# Patient Record
Sex: Female | Born: 1935 | Race: White | Hispanic: No | Marital: Married | Smoking: Never smoker
Health system: Southern US, Community
[De-identification: ages and names within clinical notes are randomized; demographics above are authoritative.]

## PROBLEM LIST (undated history)

## (undated) DIAGNOSIS — K219 Gastro-esophageal reflux disease without esophagitis: Secondary | ICD-10-CM

## (undated) DIAGNOSIS — C801 Malignant (primary) neoplasm, unspecified: Secondary | ICD-10-CM

## (undated) DIAGNOSIS — I639 Cerebral infarction, unspecified: Secondary | ICD-10-CM

## (undated) DIAGNOSIS — Z974 Presence of external hearing-aid: Secondary | ICD-10-CM

## (undated) DIAGNOSIS — I4891 Unspecified atrial fibrillation: Secondary | ICD-10-CM

## (undated) DIAGNOSIS — R911 Solitary pulmonary nodule: Secondary | ICD-10-CM

## (undated) DIAGNOSIS — R0600 Dyspnea, unspecified: Secondary | ICD-10-CM

## (undated) DIAGNOSIS — Z8719 Personal history of other diseases of the digestive system: Secondary | ICD-10-CM

## (undated) DIAGNOSIS — T8859XA Other complications of anesthesia, initial encounter: Secondary | ICD-10-CM

## (undated) DIAGNOSIS — R519 Headache, unspecified: Secondary | ICD-10-CM

## (undated) DIAGNOSIS — N183 Chronic kidney disease, stage 3 unspecified: Secondary | ICD-10-CM

## (undated) DIAGNOSIS — I10 Essential (primary) hypertension: Secondary | ICD-10-CM

## (undated) DIAGNOSIS — T4145XA Adverse effect of unspecified anesthetic, initial encounter: Secondary | ICD-10-CM

## (undated) DIAGNOSIS — I480 Paroxysmal atrial fibrillation: Secondary | ICD-10-CM

## (undated) DIAGNOSIS — J45909 Unspecified asthma, uncomplicated: Secondary | ICD-10-CM

## (undated) DIAGNOSIS — I059 Rheumatic mitral valve disease, unspecified: Secondary | ICD-10-CM

## (undated) DIAGNOSIS — E785 Hyperlipidemia, unspecified: Secondary | ICD-10-CM

## (undated) DIAGNOSIS — R51 Headache: Secondary | ICD-10-CM

## (undated) DIAGNOSIS — R42 Dizziness and giddiness: Secondary | ICD-10-CM

## (undated) HISTORY — PX: COLONOSCOPY: SHX174

## (undated) HISTORY — PX: TUBAL LIGATION: SHX77

## (undated) HISTORY — PX: OTHER SURGICAL HISTORY: SHX169

## (undated) HISTORY — PX: TONSILLECTOMY: SUR1361

## (undated) HISTORY — PX: HERNIA REPAIR: SHX51

---

## 2006-09-08 ENCOUNTER — Emergency Department: Payer: Self-pay | Admitting: Unknown Physician Specialty

## 2006-09-13 ENCOUNTER — Other Ambulatory Visit: Payer: Self-pay

## 2006-09-13 ENCOUNTER — Emergency Department: Payer: Self-pay | Admitting: Emergency Medicine

## 2007-03-07 ENCOUNTER — Ambulatory Visit: Payer: Self-pay | Admitting: Gastroenterology

## 2008-06-21 ENCOUNTER — Ambulatory Visit: Payer: Self-pay | Admitting: Internal Medicine

## 2008-12-14 HISTORY — PX: BREAST BIOPSY: SHX20

## 2008-12-29 ENCOUNTER — Ambulatory Visit: Payer: Self-pay | Admitting: Internal Medicine

## 2008-12-31 ENCOUNTER — Ambulatory Visit: Payer: Self-pay | Admitting: Internal Medicine

## 2009-01-08 ENCOUNTER — Ambulatory Visit: Payer: Self-pay | Admitting: Internal Medicine

## 2009-11-27 ENCOUNTER — Ambulatory Visit: Payer: Self-pay | Admitting: Internal Medicine

## 2010-04-23 ENCOUNTER — Ambulatory Visit: Payer: Self-pay | Admitting: Surgery

## 2010-04-23 HISTORY — PX: BREAST BIOPSY: SHX20

## 2010-08-20 ENCOUNTER — Observation Stay: Payer: Self-pay | Admitting: *Deleted

## 2011-04-22 ENCOUNTER — Ambulatory Visit: Payer: Self-pay | Admitting: Surgery

## 2011-07-22 ENCOUNTER — Other Ambulatory Visit: Payer: Self-pay | Admitting: Family Medicine

## 2011-07-22 DIAGNOSIS — N63 Unspecified lump in unspecified breast: Secondary | ICD-10-CM

## 2011-07-22 DIAGNOSIS — N644 Mastodynia: Secondary | ICD-10-CM

## 2011-07-22 DIAGNOSIS — N6452 Nipple discharge: Secondary | ICD-10-CM

## 2011-07-22 DIAGNOSIS — N6459 Other signs and symptoms in breast: Secondary | ICD-10-CM

## 2011-07-31 ENCOUNTER — Ambulatory Visit
Admission: RE | Admit: 2011-07-31 | Discharge: 2011-07-31 | Disposition: A | Discharge status: Home / Self Care | Payer: Medicare Other | Source: Ambulatory Visit | Attending: Family Medicine | Admitting: Family Medicine

## 2011-07-31 DIAGNOSIS — N6452 Nipple discharge: Secondary | ICD-10-CM

## 2011-07-31 DIAGNOSIS — N6459 Other signs and symptoms in breast: Secondary | ICD-10-CM

## 2011-07-31 DIAGNOSIS — N63 Unspecified lump in unspecified breast: Secondary | ICD-10-CM

## 2011-07-31 DIAGNOSIS — N644 Mastodynia: Secondary | ICD-10-CM

## 2011-11-11 ENCOUNTER — Ambulatory Visit: Payer: Self-pay | Admitting: Surgery

## 2011-12-15 DIAGNOSIS — I639 Cerebral infarction, unspecified: Secondary | ICD-10-CM

## 2011-12-15 HISTORY — DX: Cerebral infarction, unspecified: I63.9

## 2012-05-11 ENCOUNTER — Ambulatory Visit: Payer: Self-pay | Admitting: Surgery

## 2012-07-24 ENCOUNTER — Emergency Department: Payer: Self-pay | Admitting: Emergency Medicine

## 2012-07-24 LAB — TROPONIN I: Troponin-I: <0.02 ng/mL

## 2012-07-24 LAB — CBC
HCT: 43.4 % (ref 35.0–47.0)
HGB: 15.2 g/dL (ref 12.0–16.0)
RBC: 4.95 10*6/uL (ref 3.80–5.20)
WBC: 7.1 10*3/uL (ref 3.6–11.0)

## 2012-07-24 LAB — BASIC METABOLIC PANEL
Calcium, Total: 9.5 mg/dL (ref 8.5–10.1)
Chloride: 108 mmol/L — ABNORMAL HIGH (ref 98–107)
Co2: 21 mmol/L (ref 21–32)
Creatinine: 0.92 mg/dL (ref 0.60–1.30)
Sodium: 140 mmol/L (ref 136–145)

## 2013-05-23 ENCOUNTER — Ambulatory Visit: Payer: Self-pay | Admitting: Surgery

## 2014-07-03 ENCOUNTER — Ambulatory Visit: Payer: Self-pay | Admitting: Surgery

## 2016-09-04 ENCOUNTER — Other Ambulatory Visit: Payer: Self-pay | Admitting: Family Medicine

## 2016-09-04 DIAGNOSIS — Z1231 Encounter for screening mammogram for malignant neoplasm of breast: Secondary | ICD-10-CM

## 2016-09-10 ENCOUNTER — Other Ambulatory Visit: Payer: Self-pay | Admitting: Family Medicine

## 2016-09-10 ENCOUNTER — Ambulatory Visit
Admission: RE | Admit: 2016-09-10 | Discharge: 2016-09-10 | Disposition: A | Discharge status: Home / Self Care | Payer: Medicare Other | Source: Ambulatory Visit | Attending: Family Medicine | Admitting: Family Medicine

## 2016-09-10 DIAGNOSIS — Z1231 Encounter for screening mammogram for malignant neoplasm of breast: Secondary | ICD-10-CM | POA: Insufficient documentation

## 2016-09-10 DIAGNOSIS — R928 Other abnormal and inconclusive findings on diagnostic imaging of breast: Secondary | ICD-10-CM | POA: Insufficient documentation

## 2016-09-15 ENCOUNTER — Other Ambulatory Visit: Payer: Self-pay | Admitting: Family Medicine

## 2016-09-15 DIAGNOSIS — N6320 Unspecified lump in the left breast, unspecified quadrant: Secondary | ICD-10-CM

## 2016-09-15 DIAGNOSIS — R928 Other abnormal and inconclusive findings on diagnostic imaging of breast: Secondary | ICD-10-CM

## 2016-10-05 ENCOUNTER — Ambulatory Visit
Admission: RE | Admit: 2016-10-05 | Discharge: 2016-10-05 | Disposition: A | Discharge status: Home / Self Care | Payer: Medicare Other | Source: Ambulatory Visit | Attending: Family Medicine | Admitting: Family Medicine

## 2016-10-05 DIAGNOSIS — N6320 Unspecified lump in the left breast, unspecified quadrant: Secondary | ICD-10-CM

## 2016-10-05 DIAGNOSIS — R928 Other abnormal and inconclusive findings on diagnostic imaging of breast: Secondary | ICD-10-CM

## 2016-10-05 DIAGNOSIS — N63 Unspecified lump in unspecified breast: Secondary | ICD-10-CM | POA: Diagnosis present

## 2016-10-05 DIAGNOSIS — N6042 Mammary duct ectasia of left breast: Secondary | ICD-10-CM | POA: Diagnosis not present

## 2017-07-06 ENCOUNTER — Ambulatory Visit: Admission: RE | Admit: 2017-07-06 | Payer: Medicare Other | Source: Ambulatory Visit | Admitting: Gastroenterology

## 2017-07-06 ENCOUNTER — Encounter: Admission: RE | Payer: Self-pay | Source: Ambulatory Visit

## 2017-07-06 SURGERY — COLONOSCOPY WITH PROPOFOL
Anesthesia: General

## 2017-10-06 ENCOUNTER — Other Ambulatory Visit: Payer: Self-pay | Admitting: Family Medicine

## 2017-10-06 DIAGNOSIS — Z1231 Encounter for screening mammogram for malignant neoplasm of breast: Secondary | ICD-10-CM

## 2017-10-26 ENCOUNTER — Ambulatory Visit
Admission: RE | Admit: 2017-10-26 | Discharge: 2017-10-26 | Disposition: A | Discharge status: Home / Self Care | Payer: Medicare Other | Source: Ambulatory Visit | Attending: Family Medicine | Admitting: Family Medicine

## 2017-10-26 DIAGNOSIS — Z1231 Encounter for screening mammogram for malignant neoplasm of breast: Secondary | ICD-10-CM | POA: Insufficient documentation

## 2017-11-30 ENCOUNTER — Ambulatory Visit: Payer: Medicare Other | Attending: Otolaryngology | Admitting: Speech Pathology

## 2017-11-30 DIAGNOSIS — R490 Dysphonia: Secondary | ICD-10-CM | POA: Diagnosis not present

## 2017-12-01 ENCOUNTER — Encounter: Payer: Self-pay | Admitting: Speech Pathology

## 2017-12-01 ENCOUNTER — Other Ambulatory Visit: Payer: Self-pay

## 2017-12-01 NOTE — Therapy (Signed)
[place] MAIN [facility] SERVICES [address] [place], [place], [address] Phone: [phone]   Fax:  [phone]  Speech Language Pathology Evaluation  Patient Details  Name: Traci Holland MRN: [id number] Date of Birth: [birth year] Referring Provider: Margaretha Sheffield   Encounter Date: 11/30/2017  End of Session - 12/01/17 1338    Visit Number  1    Number of Visits  17    Date for SLP Re-Evaluation  01/31/18    SLP Start Time  1500    SLP Stop Time   1550    SLP Time Calculation (min)  50 min    Activity Tolerance  Patient tolerated treatment well       History reviewed. No pertinent past medical history.  Past Surgical History:  Procedure Laterality Date  . BREAST BIOPSY Left 04/23/2010   bx /clip-neg  . BREAST BIOPSY Right 2010   neg    There were no vitals filed for this visit.      SLP Evaluation [facility] - 12/01/17 0001      SLP Visit Information   SLP Received On  11/30/17    Referring Provider  Margaretha Sheffield    Onset Date  10/12/2017      Subjective   Subjective  My voice feels "tight" after I have been talking    Patient/Family Stated Goal  reduce hoarseness and improve vocal endurance      General Information   HPI  81 year old woman, with 9 month history of hoarseness, referred by Dr. Kathyrn Sheriff for voice therapy.  Per report, abnormal laryngeal findings include: arytenoid edema, arytenoid erythema, interarytenoid erythema, interarytenoid pachydermia, and false cord voicing.       Prior Functional Status   Cognitive/Linguistic Baseline  Within functional limits      Oral Motor/Sensory Function   Overall Oral Motor/Sensory Function  Appears within functional limits for tasks assessed      Motor Speech   Overall Motor Speech  Impaired    Respiration  Impaired    Level of Impairment  Conversation    Phonation  Hoarse;Low vocal intensity;Aphonic    Resonance  Within functional limits    Articulation  Within functional  limitis    Intelligibility  Intelligible    Motor Planning  Witnin functional limits    Phonation  Impaired    Vocal Abuses  Habitual Hyperphonia;Habitual Cough/Throat Clear    Tension Present  Jaw;Neck;Shoulder    Volume  Soft    Pitch  Appropriate      Standardized Assessments   Standardized Assessments   Other Assessment Perceptual Voice Evaluation        Perceptual Voice Evaluation Voice checklist: . Health risks: GERD, allergies  . Characteristic voice use: patient states she talks and sings a lot  . Environmental risks: sole caregiver for relative with dementia . Misuse: excessive talking/singing . Abuse: coughing/throat clearing . Vocal characteristics: vocal fatigue, limited pitch range, poor vocal projection, tight/strained phonation Maximum phonation time for sustained "ah": 5 seconds Average time patient was able to sustain /s/: 4 seconds Average time patient was able to sustain /z/: 4 seconds s/z ratio : 1 Education: Patient instructed in extrinsic laryngeal muscle stretches and breath support exercises  SLP Education - 12/01/17 1337    Education provided  Yes    Education Details  Voice therapy, neck/tongue/throat stretches, breath support exercises    Person(s) Educated  Patient    Methods  Explanation;Demonstration;Handout    Comprehension  Verbalized understanding;Need  further instruction         SLP Long Term Goals - 12/01/17 1340      SLP LONG TERM GOAL #1   Title  The patient will demonstrate independent understanding of vocal hygiene concepts and extrinsic laryngeal muscle stretches.      Time  8    Period  Weeks    Status  New    Target Date  01/31/18      SLP LONG TERM GOAL #2   Title  The patient will be independent for abdominal breathing and breath support exercises.    Time  8    Period  Weeks    Status  New    Target Date  01/31/18      SLP LONG TERM GOAL #3   Title  The patient will minimize vocal tension via resonant voice therapy  (or comparable technique) with min SLP cues with 80% accuracy.    Time  8    Period  Weeks    Status  New    Target Date  01/31/18      SLP LONG TERM GOAL #4   Title  The patient will maintain relaxed phonation / oral resonance for paragraph length recitation with 80% accuracy.    Time  8    Period  Weeks    Status  New    Target Date  01/31/18       Plan - 12/01/17 1339    Clinical Impression Statement  This 81 year old woman under the care of Dr. Kathyrn Sheriff, with arytenoid edema, arytenoid erythema, interarytenoid erythema, interarytenoid pachydermia, and false cord voicing, is presenting with moderate dysphonia characterized by hoarse vocal quality, vocal fatigue, reduced breath control for speech, reduced pitch range, and intrinsic/extrinsic laryngeal tension.  The patient will benefit from voice therapy for education, to improve breath support, improve tone focus, promote easy flow phonation, and learn techniques to increase loudness and pitch range without strain.    Speech Therapy Frequency  2x / week    Duration  Other (comment) 8 weeks    Potential to Achieve Goals  Good    Potential Considerations  Ability to learn/carryover information;Severity of impairments;Co-morbidities;Cooperation/participation level;Family/community support;Medical prognosis;Previous level of function    SLP Home Exercise Plan  neck/tongue/throat stretches, breath support exercises    Consulted and Agree with Plan of Care  Patient       Patient will benefit from skilled therapeutic intervention in order to improve the following deficits and impairments:   Dysphonia - Plan: SLP plan of care cert/re-cert  G-Codes - 05/39/76 1344    Functional Assessment Tool Used  Perceptual Voice Evaluation, clinical judgment    Functional Limitations  Voice    Voice Current Status (G9171)  At least 40 percent but less than 60 percent impaired, limited or restricted    Voice Goal Status (G9172)  At least 1 percent but  less than 20 percent impaired, limited or restricted       Problem List There are no active problems to display for this patient.  Leroy Sea, MS/CCC- SLP  Lou Miner 12/01/2017, 1:46 PM  [place] MAIN [facility] SERVICES [address] [place], [place], [address] Phone: [phone]   Fax:  [phone]  Name: Traci Holland MRN: [id number] Date of Birth: [birth year]

## 2017-12-02 ENCOUNTER — Ambulatory Visit: Payer: Medicare Other | Admitting: Speech Pathology

## 2017-12-02 ENCOUNTER — Other Ambulatory Visit: Payer: Self-pay

## 2017-12-02 ENCOUNTER — Encounter: Payer: Self-pay | Admitting: Speech Pathology

## 2017-12-02 DIAGNOSIS — R490 Dysphonia: Secondary | ICD-10-CM | POA: Diagnosis not present

## 2017-12-02 NOTE — Therapy (Signed)
[place] MAIN [facility] SERVICES [address] [place], [place], [address] Phone: [phone]   Fax:  [phone]  Speech Language Pathology Treatment  Patient Details  Name: Traci Holland MRN: [id number] Date of Birth: Nov 03, 1936 Referring Provider: Margaretha Sheffield   Encounter Date: 12/02/2017  End of Session - 12/02/17 1559    Visit Number  2    Number of Visits  17    Date for SLP Re-Evaluation  01/31/18    SLP Start Time  1500    SLP Stop Time   1545    SLP Time Calculation (min)  45 min    Activity Tolerance  Patient tolerated treatment well       History reviewed. No pertinent past medical history.  Past Surgical History:  Procedure Laterality Date  . BREAST BIOPSY Left 04/23/2010   bx /clip-neg  . BREAST BIOPSY Right 2010   neg    There were no vitals filed for this visit.  Subjective Assessment - 12/02/17 1558    Subjective  "I've been trying!"            ADULT SLP TREATMENT - 12/02/17 0001      General Information   Behavior/Cognition  Alert;Cooperative;Pleasant mood    HPI  81 year old woman, with 9 month history of hoarseness, referred by Dr. Kathyrn Sheriff for voice therapy.  Per report, abnormal laryngeal findings include: arytenoid edema, arytenoid erythema, interarytenoid erythema, interarytenoid pachydermia, and false cord voicing.        Treatment Provided   Treatment provided  Cognitive-Linquistic      Pain Assessment   Pain Assessment  No/denies pain      Cognitive-Linquistic Treatment   Treatment focused on  Voice    Skilled Treatment  The patient was provided with written and verbal teaching regarding neck, shoulder, tongue, and throat stretches exercises to promote relaxed phonation. The patient was provided with written and verbal teaching for supplement vocal tract relaxation exercises (straw phonation).  The patient was provided with written and verbal teaching regarding breath support exercises.  The  patient is able to sustain unvoiced fricative up to 14 seconds (only 4 seconds at the evaluation).  Patient instructed in relaxed phonation / oral resonance. The patient responded well to resonant voice therapy, specifically the hum.  She can achieve clear vocal quality with hum and inconsistently with /m/ + vowel syllables.      Assessment / Recommendations / Plan   Plan  Continue with current plan of care      Progression Toward Goals   Progression toward goals  Progressing toward goals       SLP Education - 12/02/17 1558    Education provided  Yes    Education Details  resonant voice, voice production    Person(s) Educated  Patient    Methods  Explanation    Comprehension  Verbalized understanding         SLP Long Term Goals - 12/01/17 1340      SLP LONG TERM GOAL #1   Title  The patient will demonstrate independent understanding of vocal hygiene concepts and extrinsic laryngeal muscle stretches.      Time  8    Period  Weeks    Status  New    Target Date  01/31/18      SLP LONG TERM GOAL #2   Title  The patient will be independent for abdominal breathing and breath support exercises.    Time  8  Period  Weeks    Status  New    Target Date  01/31/18      SLP LONG TERM GOAL #3   Title  The patient will minimize vocal tension via resonant voice therapy (or comparable technique) with min SLP cues with 80% accuracy.    Time  8    Period  Weeks    Status  New    Target Date  01/31/18      SLP LONG TERM GOAL #4   Title  The patient will maintain relaxed phonation / oral resonance for paragraph length recitation with 80% accuracy.    Time  8    Period  Weeks    Status  New    Target Date  01/31/18       Plan - 12/02/17 1559    Clinical Impression Statement  The patient is able to maintain airflow with greater control.  She responded well to resonant voice therapy, specifically the hum.  She can achieve clear vocal quality with hum and inconsistently with /m/ +  vowel syllables.    Speech Therapy Frequency  2x / week    Duration  Other (comment)    Potential to Achieve Goals  Good    Potential Considerations  Ability to learn/carryover information;Severity of impairments;Co-morbidities;Cooperation/participation level;Family/community support;Medical prognosis;Previous level of function    SLP Home Exercise Plan  neck/tongue/throat stretches, breath support exercises, resonant voice exercise    Consulted and Agree with Plan of Care  Patient       Patient will benefit from skilled therapeutic intervention in order to improve the following deficits and impairments:   Dysphonia  G-Codes - 12-29-2017 1344    Functional Assessment Tool Used  Perceptual Voice Evaluation, clinical judgment    Functional Limitations  Voice    Voice Current Status (G9171)  At least 40 percent but less than 60 percent impaired, limited or restricted    Voice Goal Status (G9172)  At least 1 percent but less than 20 percent impaired, limited or restricted       Problem List There are no active problems to display for this patient.  Leroy Sea, [place], Susie 12/02/2017, 4:00 PM  [place] MAIN [facility] SERVICES [address] [place], [place], [address] Phone: [phone]   Fax:  [phone]   Name: Traci Holland MRN: [id number] Date of Birth: [birth year]

## 2017-12-10 ENCOUNTER — Ambulatory Visit: Payer: Medicare Other | Admitting: Speech Pathology

## 2017-12-15 ENCOUNTER — Other Ambulatory Visit: Payer: Self-pay

## 2017-12-15 ENCOUNTER — Encounter: Payer: Self-pay | Admitting: Speech Pathology

## 2017-12-15 ENCOUNTER — Ambulatory Visit: Payer: Medicare Other | Attending: Otolaryngology | Admitting: Speech Pathology

## 2017-12-15 DIAGNOSIS — R490 Dysphonia: Secondary | ICD-10-CM | POA: Diagnosis present

## 2017-12-15 NOTE — Therapy (Signed)
[place] MAIN [facility] SERVICES [address] [place], [place], [address] Phone: [phone]   Fax:  [phone]  Speech Language Pathology Treatment  Patient Details  Name: Adriyana Greenbaum MRN: [id number] Date of Birth: 12-Jul-1936 Referring Provider: Margaretha Sheffield   Encounter Date: 12/15/2017  End of Session - 12/15/17 1556    Visit Number  3    Number of Visits  17    Date for SLP Re-Evaluation  01/31/18    SLP Start Time  8242    SLP Stop Time   3536    SLP Time Calculation (min)  59 min    Activity Tolerance  Patient tolerated treatment well       History reviewed. No pertinent past medical history.  Past Surgical History:  Procedure Laterality Date  . BREAST BIOPSY Left 04/23/2010   bx /clip-neg  . BREAST BIOPSY Right 2010   neg    There were no vitals filed for this visit.  Subjective Assessment - 12/15/17 1556    Subjective  "I've been trying!"    Currently in Pain?  No/denies            ADULT SLP TREATMENT - 12/15/17 0001      General Information   Behavior/Cognition  Alert;Cooperative;Pleasant mood    HPI  82 year old woman, with 9 month history of hoarseness, referred by Dr. Kathyrn Sheriff for voice therapy.  Per report, abnormal laryngeal findings include: arytenoid edema, arytenoid erythema, interarytenoid erythema, interarytenoid pachydermia, and false cord voicing.        Treatment Provided   Treatment provided  Cognitive-Linquistic      Pain Assessment   Pain Assessment  No/denies pain      Cognitive-Linquistic Treatment   Treatment focused on  Voice    Skilled Treatment  The patient was provided with written and verbal teaching regarding neck, shoulder, tongue, and throat stretches exercises to promote relaxed phonation. The patient was provided with written and verbal teaching for supplement vocal tract relaxation exercises (straw phonation).  The patient was provided with written and verbal teaching regarding  breath support exercises.  The patient is able to sustain unvoiced fricative up to 20 seconds (only 4 seconds at the evaluation).  Patient instructed in relaxed phonation / oral resonance. The patient responded well to resonant voice therapy, specifically the hum.  She can achieve clear vocal quality with hum and with /m/ + vowel syllables.  Patient reports that her voice sounds more hoarse to herself than it does to me.  She tried phonating (singing, sustained vowel, conversation) with the hearing aids out and reported that she felt better and sounded less raspy (to herself).  Her voice was less strained without the aids in.       Assessment / Recommendations / Plan   Plan  Continue with current plan of care      Progression Toward Goals   Progression toward goals  Progressing toward goals       SLP Education - 12/15/17 1556    Education provided  Yes    Education Details  voice production    Person(s) Educated  Patient    Methods  Explanation    Comprehension  Verbalized understanding         SLP Long Term Goals - 12/01/17 1340      SLP LONG TERM GOAL #1   Title  The patient will demonstrate independent understanding of vocal hygiene concepts and extrinsic laryngeal muscle stretches.  Time  8    Period  Weeks    Status  New    Target Date  01/31/18      SLP LONG TERM GOAL #2   Title  The patient will be independent for abdominal breathing and breath support exercises.    Time  8    Period  Weeks    Status  New    Target Date  01/31/18      SLP LONG TERM GOAL #3   Title  The patient will minimize vocal tension via resonant voice therapy (or comparable technique) with min SLP cues with 80% accuracy.    Time  8    Period  Weeks    Status  New    Target Date  01/31/18      SLP LONG TERM GOAL #4   Title  The patient will maintain relaxed phonation / oral resonance for paragraph length recitation with 80% accuracy.    Time  8    Period  Weeks    Status  New    Target  Date  01/31/18       Plan - 12/15/17 1557    Clinical Impression Statement  The patient is able to maintain airflow with greater control.  She responded well to resonant voice therapy, specifically the hum.  She can achieve clear vocal quality with hum and inconsistently with /m/ + vowel syllables.  She appears to be getting faulty feedback from her hearing aids and will practice reading aloud and singing with the aids out.    Speech Therapy Frequency  2x / week    Duration  Other (comment)    Treatment/Interventions  Other (comment) Voice therapy    Potential to Achieve Goals  Good    Potential Considerations  Ability to learn/carryover information;Severity of impairments;Co-morbidities;Cooperation/participation level;Family/community support;Medical prognosis;Previous level of function    SLP Home Exercise Plan  neck/tongue/throat stretches, breath support exercises, resonant voice exercise    Consulted and Agree with Plan of Care  Patient       Patient will benefit from skilled therapeutic intervention in order to improve the following deficits and impairments:   Dysphonia    Problem List There are no active problems to display for this patient.  Leroy Sea, [place], Susie 12/15/2017, 3:58 PM  [place] MAIN [facility] SERVICES [address] [place], [place], [address] Phone: [phone]   Fax:  [phone]   Name: Colisha Redler MRN: [id number] Date of Birth: 1935-12-25

## 2017-12-22 ENCOUNTER — Encounter: Payer: Self-pay | Admitting: Speech Pathology

## 2017-12-22 ENCOUNTER — Other Ambulatory Visit: Payer: Self-pay

## 2017-12-22 ENCOUNTER — Ambulatory Visit: Payer: Medicare Other | Admitting: Speech Pathology

## 2017-12-22 DIAGNOSIS — R490 Dysphonia: Secondary | ICD-10-CM

## 2017-12-22 NOTE — Therapy (Signed)
[place] MAIN [facility] SERVICES [address] [place], [place], [address] Phone: [phone]   Fax:  [phone]  Speech Language Pathology Treatment  Patient Details  Name: Analiya Porco MRN: [id number] Date of Birth: Nov 30, 1936 Referring Provider: Margaretha Sheffield   Encounter Date: 12/22/2017  End of Session - 12/22/17 1305    Visit Number  4    Number of Visits  17    Date for SLP Re-Evaluation  01/31/18    SLP Start Time  1000    SLP Stop Time   1287    SLP Time Calculation (min)  45 min    Activity Tolerance  Patient tolerated treatment well       History reviewed. No pertinent past medical history.  Past Surgical History:  Procedure Laterality Date  . BREAST BIOPSY Left 04/23/2010   bx /clip-neg  . BREAST BIOPSY Right 2010   neg    There were no vitals filed for this visit.  Subjective Assessment - 12/22/17 1304    Subjective  "I've been trying!"    Currently in Pain?  No/denies            ADULT SLP TREATMENT - 12/22/17 0001      General Information   Behavior/Cognition  Alert;Cooperative;Pleasant mood    HPI  82 year old woman, with 9 month history of hoarseness, referred by Dr. Kathyrn Sheriff for voice therapy.  Per report, abnormal laryngeal findings include: arytenoid edema, arytenoid erythema, interarytenoid erythema, interarytenoid pachydermia, and false cord voicing.        Treatment Provided   Treatment provided  Cognitive-Linquistic      Pain Assessment   Pain Assessment  No/denies pain      Cognitive-Linquistic Treatment   Treatment focused on  Voice    Skilled Treatment  The patient was provided with written and verbal teaching regarding neck, shoulder, tongue, and throat stretches exercises to promote relaxed phonation. The patient was provided with written and verbal teaching for supplement vocal tract relaxation exercises (straw phonation).  The patient was provided with written and verbal teaching regarding  breath support exercises.  The patient is able to sustain unvoiced fricative up to 20 seconds (only 4 seconds at the evaluation).  Patient instructed in relaxed phonation / oral resonance. Patient reports that her voice sounds more hoarse to herself than it does to me, her vocal loudness is very soft.  She is able to have 40 minute conversation with improved vocal quality and loudness with the hearing aids out; she reported that she felt better and sounded less raspy (to herself).        Assessment / Recommendations / Plan   Plan  Continue with current plan of care      Progression Toward Goals   Progression toward goals  Progressing toward goals       SLP Education - 12/22/17 1305    Education provided  Yes    Education Details  practice with hearing aids out    Person(s) Educated  Patient    Methods  Explanation    Comprehension  Verbalized understanding         SLP Long Term Goals - 12/01/17 1340      SLP LONG TERM GOAL #1   Title  The patient will demonstrate independent understanding of vocal hygiene concepts and extrinsic laryngeal muscle stretches.      Time  8    Period  Weeks    Status  New  Target Date  01/31/18      SLP LONG TERM GOAL #2   Title  The patient will be independent for abdominal breathing and breath support exercises.    Time  8    Period  Weeks    Status  New    Target Date  01/31/18      SLP LONG TERM GOAL #3   Title  The patient will minimize vocal tension via resonant voice therapy (or comparable technique) with min SLP cues with 80% accuracy.    Time  8    Period  Weeks    Status  New    Target Date  01/31/18      SLP LONG TERM GOAL #4   Title  The patient will maintain relaxed phonation / oral resonance for paragraph length recitation with 80% accuracy.    Time  8    Period  Weeks    Status  New    Target Date  01/31/18       Plan - 12/22/17 1305    Clinical Impression Statement  The patient is able to maintain airflow with greater  control.  She responded well to resonant voice therapy, specifically the hum.  She appears to be getting faulty feedback from her hearing aids; today she was able to maintain appropriate vocal loudness and quality for 30-40 minutes of conversation with the hearing aids out.      Speech Therapy Frequency  2x / week    Duration  Other (comment)    Treatment/Interventions  Other (comment);Patient/family education Voice therapy    Potential to Achieve Goals  Good    Potential Considerations  Ability to learn/carryover information;Severity of impairments;Co-morbidities;Cooperation/participation level;Family/community support;Medical prognosis;Previous level of function    SLP Home Exercise Plan  neck/tongue/throat stretches, breath support exercises, resonant voice exercise    Consulted and Agree with Plan of Care  Patient       Patient will benefit from skilled therapeutic intervention in order to improve the following deficits and impairments:   Dysphonia    Problem List There are no active problems to display for this patient.  Leroy Sea, MS/CCC- SLP  Lou Miner 12/22/2017, 1:06 PM  [place] MAIN [facility] SERVICES [address] [place], [place], [address] Phone: [phone]   Fax:  [phone]   Name: Shaquanna Lycan MRN: [id number] Date of Birth: [birth year]

## 2017-12-24 ENCOUNTER — Encounter: Payer: Self-pay | Admitting: Speech Pathology

## 2017-12-24 ENCOUNTER — Ambulatory Visit: Payer: Medicare Other | Admitting: Speech Pathology

## 2017-12-24 DIAGNOSIS — R490 Dysphonia: Secondary | ICD-10-CM

## 2017-12-24 NOTE — Therapy (Signed)
[place] MAIN [facility] SERVICES [address] [place], [place], [address] Phone: [phone]   Fax:  [phone]  Speech Language Pathology Treatment  Patient Details  Name: Traci Holland MRN: [id number] Date of Birth: 21-May-1936 Referring Provider: Margaretha Sheffield   Encounter Date: 12/24/2017  End of Session - 12/24/17 1209    Visit Number  5    Number of Visits  17    Date for SLP Re-Evaluation  01/31/18    SLP Start Time  58    SLP Stop Time   1150    SLP Time Calculation (min)  60 min    Activity Tolerance  Patient tolerated treatment well       History reviewed. No pertinent past medical history.  Past Surgical History:  Procedure Laterality Date  . BREAST BIOPSY Left 04/23/2010   bx /clip-neg  . BREAST BIOPSY Right 2010   neg    There were no vitals filed for this visit.  Subjective Assessment - 12/24/17 1208    Subjective  "I've been trying!"            ADULT SLP TREATMENT - 12/24/17 0001      General Information   Behavior/Cognition  Alert;Cooperative;Pleasant mood    HPI  82 year old woman, with 9 month history of hoarseness, referred by Dr. Kathyrn Sheriff for voice therapy.  Per report, abnormal laryngeal findings include: arytenoid edema, arytenoid erythema, interarytenoid erythema, interarytenoid pachydermia, and false cord voicing.        Treatment Provided   Treatment provided  Cognitive-Linquistic      Pain Assessment   Pain Assessment  No/denies pain      Cognitive-Linquistic Treatment   Treatment focused on  Voice    Skilled Treatment  The patient was provided with written and verbal teaching regarding neck, shoulder, tongue, and throat stretches exercises to promote relaxed phonation. The patient was provided with written and verbal teaching for supplement vocal tract relaxation exercises (straw phonation).  The patient was provided with written and verbal teaching regarding breath support exercises.  The  patient is able to sustain unvoiced fricative up to 20 seconds (only 4 seconds at the evaluation).  Patient instructed in relaxed phonation / oral resonance. Patient reports that her voice sounds more hoarse to herself than it does to me, her vocal loudness is very soft.  Patient able to generate louder, better quality voice for sustained vowel and automatic speech series with 80% accuracy.  She is able to detect tense phonation and demonstrates emerging ability to repair tense productions.      Assessment / Recommendations / Plan   Plan  Continue with current plan of care      Progression Toward Goals   Progression toward goals  Progressing toward goals       SLP Education - 12/24/17 1208    Education provided  Yes    Education Details  learn to reproduce good sound    Person(s) Educated  Patient    Methods  Explanation    Comprehension  Verbalized understanding         SLP Long Term Goals - 12/01/17 1340      SLP LONG TERM GOAL #1   Title  The patient will demonstrate independent understanding of vocal hygiene concepts and extrinsic laryngeal muscle stretches.      Time  8    Period  Weeks    Status  New    Target Date  01/31/18  SLP LONG TERM GOAL #2   Title  The patient will be independent for abdominal breathing and breath support exercises.    Time  8    Period  Weeks    Status  New    Target Date  01/31/18      SLP LONG TERM GOAL #3   Title  The patient will minimize vocal tension via resonant voice therapy (or comparable technique) with min SLP cues with 80% accuracy.    Time  8    Period  Weeks    Status  New    Target Date  01/31/18      SLP LONG TERM GOAL #4   Title  The patient will maintain relaxed phonation / oral resonance for paragraph length recitation with 80% accuracy.    Time  8    Period  Weeks    Status  New    Target Date  01/31/18       Plan - 12/24/17 1210    Clinical Impression Statement  The patient is able to generate a louder,  better quality voice with focus on vocal loudness to reduce laryngeal tension.  She demonstrates ability to detect tense voice and emerging ability to repair tense productions.    Speech Therapy Frequency  2x / week    Duration  Other (comment)    Treatment/Interventions  Other (comment);Patient/family education Voice therapy    Potential to Achieve Goals  Good    Potential Considerations  Ability to learn/carryover information;Severity of impairments;Co-morbidities;Cooperation/participation level;Family/community support;Medical prognosis;Previous level of function    SLP Home Exercise Plan  neck/tongue/throat stretches, breath support exercises, resonant voice exercise    Consulted and Agree with Plan of Care  Patient       Patient will benefit from skilled therapeutic intervention in order to improve the following deficits and impairments:   Dysphonia    Problem List There are no active problems to display for this patient.  Leroy Sea, MS/CCC- SLP  Lou Miner 12/24/2017, 12:13 PM  [place] MAIN [facility] SERVICES [address] [place], [place], [address] Phone: [phone]   Fax:  [phone]   Name: Traci Holland MRN: [id number] Date of Birth: 1936-09-10

## 2017-12-27 ENCOUNTER — Encounter: Payer: Self-pay | Admitting: Speech Pathology

## 2017-12-27 ENCOUNTER — Ambulatory Visit: Payer: Medicare Other | Admitting: Speech Pathology

## 2017-12-27 DIAGNOSIS — R490 Dysphonia: Secondary | ICD-10-CM

## 2017-12-27 NOTE — Therapy (Signed)
[place] MAIN [facility] SERVICES [address] [place], [place], [address] Phone: [phone]   Fax:  [phone]  Speech Language Pathology Treatment  Patient Details  Name: Traci Holland MRN: [id number] Date of Birth: 1936/05/09 Referring Provider: Margaretha Sheffield   Encounter Date: 12/27/2017  End of Session - 12/27/17 1438    Visit Number  6    Number of Visits  17    Date for SLP Re-Evaluation  01/31/18    SLP Start Time  6440    SLP Stop Time   1425    SLP Time Calculation (min)  40 min    Activity Tolerance  Patient tolerated treatment well       History reviewed. No pertinent past medical history.  Past Surgical History:  Procedure Laterality Date  . BREAST BIOPSY Left 04/23/2010   bx /clip-neg  . BREAST BIOPSY Right 2010   neg    There were no vitals filed for this visit.  Subjective Assessment - 12/27/17 1436    Subjective  Patient reports (and states son concurs) that her loudness and vocal quality are much better when she does not wear her hearing aids            ADULT SLP TREATMENT - 12/27/17 0001      General Information   Behavior/Cognition  Alert;Cooperative;Pleasant mood    HPI  82 year old woman, with 9 month history of hoarseness, referred by Dr. Kathyrn Sheriff for voice therapy.  Per report, abnormal laryngeal findings include: arytenoid edema, arytenoid erythema, interarytenoid erythema, interarytenoid pachydermia, and false cord voicing.        Treatment Provided   Treatment provided  Cognitive-Linquistic      Pain Assessment   Pain Assessment  No/denies pain      Cognitive-Linquistic Treatment   Treatment focused on  Voice    Skilled Treatment  The patient was provided with written and verbal teaching regarding neck, shoulder, tongue, and throat stretches exercises to promote relaxed phonation. The patient was provided with written and verbal teaching for supplement vocal tract relaxation exercises (straw  phonation).  The patient was provided with written and verbal teaching regarding breath support exercises.  The patient is able to sustain unvoiced fricative up to 20 seconds (only 4 seconds at the evaluation).  Patient instructed in relaxed phonation / oral resonance. Patient reports that her voice sounds more hoarse to herself than it does to me, her vocal loudness is very soft.  Patient able to generate louder, better quality voice for sustained vowel, automatic speech series with 80% accuracy and reading sentences with 60% accuracy.  She is able to detect tense phonation and demonstrates emerging ability to repair tense productions.      Assessment / Recommendations / Plan   Plan  Continue with current plan of care      Progression Toward Goals   Progression toward goals  Progressing toward goals       SLP Education - 12/27/17 1437    Education provided  Yes    Education Details  alternate louder speech without hearing aids and with hearing aids to try to adjust to the feedback    Person(s) Educated  Patient    Methods  Explanation    Comprehension  Verbalized understanding         SLP Long Term Goals - 12/01/17 1340      SLP LONG TERM GOAL #1   Title  The patient will demonstrate independent understanding of vocal  hygiene concepts and extrinsic laryngeal muscle stretches.      Time  8    Period  Weeks    Status  New    Target Date  01/31/18      SLP LONG TERM GOAL #2   Title  The patient will be independent for abdominal breathing and breath support exercises.    Time  8    Period  Weeks    Status  New    Target Date  01/31/18      SLP LONG TERM GOAL #3   Title  The patient will minimize vocal tension via resonant voice therapy (or comparable technique) with min SLP cues with 80% accuracy.    Time  8    Period  Weeks    Status  New    Target Date  01/31/18      SLP LONG TERM GOAL #4   Title  The patient will maintain relaxed phonation / oral resonance for paragraph  length recitation with 80% accuracy.    Time  8    Period  Weeks    Status  New    Target Date  01/31/18       Plan - 12/27/17 1438    Clinical Impression Statement  The patient is able to generate a louder, better quality voice with focus on vocal loudness to reduce laryngeal tension.  She demonstrates ability to detect tense voice and emerging ability to repair tense productions.    Speech Therapy Frequency  2x / week    Duration  Other (comment)    Treatment/Interventions  Other (comment);Patient/family education Voice therapy    Potential to Achieve Goals  Good    Potential Considerations  Ability to learn/carryover information;Severity of impairments;Co-morbidities;Cooperation/participation level;Family/community support;Medical prognosis;Previous level of function    SLP Home Exercise Plan  neck/tongue/throat stretches, breath support exercises, resonant voice exercise    Consulted and Agree with Plan of Care  Patient       Patient will benefit from skilled therapeutic intervention in order to improve the following deficits and impairments:   Dysphonia    Problem List There are no active problems to display for this patient.  Leroy Sea, MS/CCC- SLP  Lou Miner 12/27/2017, 2:39 PM  [place] MAIN [facility] SERVICES [address] [place], [place], [address] Phone: [phone]   Fax:  [phone]   Name: Traci Holland MRN: [id number] Date of Birth: [birth year]

## 2017-12-30 ENCOUNTER — Ambulatory Visit: Payer: Medicare Other | Admitting: Speech Pathology

## 2017-12-30 DIAGNOSIS — R490 Dysphonia: Secondary | ICD-10-CM | POA: Diagnosis not present

## 2017-12-31 ENCOUNTER — Encounter: Payer: Self-pay | Admitting: Speech Pathology

## 2017-12-31 NOTE — Therapy (Signed)
[place] MAIN [facility] SERVICES [address] [place], [place], [address] Phone: [phone]   Fax:  [phone]  Speech Language Pathology Treatment  Patient Details  Name: Traci Holland MRN: [id number] Date of Birth: Feb 21, 1936 Referring Provider: Margaretha Sheffield   Encounter Date: 12/30/2017  End of Session - 12/31/17 0954    Visit Number  7    Number of Visits  17    Date for SLP Re-Evaluation  01/31/18    SLP Start Time  1400    SLP Stop Time   1454    SLP Time Calculation (min)  54 min    Activity Tolerance  Patient tolerated treatment well       History reviewed. No pertinent past medical history.  Past Surgical History:  Procedure Laterality Date  . BREAST BIOPSY Left 04/23/2010   bx /clip-neg  . BREAST BIOPSY Right 2010   neg    There were no vitals filed for this visit.  Subjective Assessment - 12/31/17 0953    Subjective  Patient reports (and states son concurs) that her loudness and vocal quality are much better when she does not wear her hearing aids            ADULT SLP TREATMENT - 12/31/17 0001      General Information   Behavior/Cognition  Alert;Cooperative;Pleasant mood    HPI  82 year old woman, with 9 month history of hoarseness, referred by Dr. Kathyrn Sheriff for voice therapy.  Per report, abnormal laryngeal findings include: arytenoid edema, arytenoid erythema, interarytenoid erythema, interarytenoid pachydermia, and false cord voicing.        Treatment Provided   Treatment provided  Cognitive-Linquistic      Pain Assessment   Pain Assessment  No/denies pain      Cognitive-Linquistic Treatment   Treatment focused on  Voice    Skilled Treatment  The patient was provided with written and verbal teaching regarding neck, shoulder, tongue, and throat stretches exercises to promote relaxed phonation. The patient was provided with written and verbal teaching for supplement vocal tract relaxation exercises (straw  phonation).  The patient was provided with written and verbal teaching regarding breath support exercises.  The patient is able to sustain unvoiced fricative up to 20 seconds (only 4 seconds at the evaluation).  Patient instructed in relaxed phonation / oral resonance. Patient reports that her voice sounds more hoarse to herself than it does to me, her vocal loudness is very soft.  The patient and family members have noted that her voice is more clear and louder when she does not have her hearing aids in (she reports uncomfortable feedback if she tries to speak louder). Today, with hearing aids turned down to about half way, she is able to have 40 minute conversation with improved vocal quality and loudness with the hearing aids out; she reported that she felt better and sounded less raspy (to herself).        Assessment / Recommendations / Plan   Plan  Continue with current plan of care      Progression Toward Goals   Progression toward goals  Progressing toward goals       SLP Education - 12/31/17 0953    Education provided  Yes    Education Details  alternate luder speech with and without hearing aids to adjust to hearing aid feedback    Person(s) Educated  Patient    Methods  Explanation    Comprehension  Verbalized understanding  SLP Long Term Goals - 12/01/17 1340      SLP LONG TERM GOAL #1   Title  The patient will demonstrate independent understanding of vocal hygiene concepts and extrinsic laryngeal muscle stretches.      Time  8    Period  Weeks    Status  New    Target Date  01/31/18      SLP LONG TERM GOAL #2   Title  The patient will be independent for abdominal breathing and breath support exercises.    Time  8    Period  Weeks    Status  New    Target Date  01/31/18      SLP LONG TERM GOAL #3   Title  The patient will minimize vocal tension via resonant voice therapy (or comparable technique) with min SLP cues with 80% accuracy.    Time  8    Period   Weeks    Status  New    Target Date  01/31/18      SLP LONG TERM GOAL #4   Title  The patient will maintain relaxed phonation / oral resonance for paragraph length recitation with 80% accuracy.    Time  8    Period  Weeks    Status  New    Target Date  01/31/18       Plan - 12/31/17 0955    Clinical Impression Statement  The patient is able to generate a louder, better quality voice with focus on vocal loudness to reduce laryngeal tension.  She demonstrates ability to detect tense voice and emerging ability to repair tense productions.    Speech Therapy Frequency  2x / week    Duration  Other (comment)    Treatment/Interventions  Other (comment);Patient/family education Voice therapy    Potential to Achieve Goals  Good    SLP Home Exercise Plan  neck/tongue/throat stretches, breath support exercises, resonant voice exercise    Consulted and Agree with Plan of Care  Patient       Patient will benefit from skilled therapeutic intervention in order to improve the following deficits and impairments:   Dysphonia    Problem List There are no active problems to display for this patient.  Leroy Sea, MS/CCC- SLP  Traci Holland 12/31/2017, 9:55 AM  [place] MAIN [facility] SERVICES [address] [place], [place], [address] Phone: [phone]   Fax:  [phone]   Name: Traci Holland MRN: [id number] Date of Birth: Sep 10, 1936

## 2018-01-03 ENCOUNTER — Ambulatory Visit: Payer: Medicare Other | Admitting: Speech Pathology

## 2018-01-03 DIAGNOSIS — R490 Dysphonia: Secondary | ICD-10-CM

## 2018-01-04 ENCOUNTER — Encounter: Payer: Self-pay | Admitting: Speech Pathology

## 2018-01-04 NOTE — Therapy (Signed)
[place] MAIN [facility] SERVICES [address] [place], [place], [address] Phone: [phone]   Fax:  [phone]  Speech Language Pathology Treatment  Patient Details  Name: Traci Holland MRN: [id number] Date of Birth: [birth year] Referring Provider: Margaretha Sheffield   Encounter Date: 01/03/2018  End of Session - 01/04/18 0831    Visit Number  8    Number of Visits  17    Date for SLP Re-Evaluation  01/31/18    SLP Start Time  1351    SLP Stop Time   1436    SLP Time Calculation (min)  45 min    Activity Tolerance  Patient tolerated treatment well       History reviewed. No pertinent past medical history.  Past Surgical History:  Procedure Laterality Date  . BREAST BIOPSY Left 04/23/2010   bx /clip-neg  . BREAST BIOPSY Right 2010   neg    There were no vitals filed for this visit.  Subjective Assessment - 01/04/18 0830    Subjective  Patient reports (and states son concurs) that her loudness and vocal quality are much better when she does not wear her hearing aids            ADULT SLP TREATMENT - 01/04/18 0001      General Information   Behavior/Cognition  Alert;Cooperative;Pleasant mood    HPI  82 year old woman, with 9 month history of hoarseness, referred by Dr. Kathyrn Sheriff for voice therapy.  Per report, abnormal laryngeal findings include: arytenoid edema, arytenoid erythema, interarytenoid erythema, interarytenoid pachydermia, and false cord voicing.        Treatment Provided   Treatment provided  Cognitive-Linquistic      Pain Assessment   Pain Assessment  No/denies pain      Cognitive-Linquistic Treatment   Treatment focused on  Voice    Skilled Treatment  The patient was provided with written and verbal teaching regarding neck, shoulder, tongue, and throat stretches exercises to promote relaxed phonation. The patient was provided with written and verbal teaching for supplement vocal tract relaxation exercises (straw  phonation).  The patient was provided with written and verbal teaching regarding breath support exercises.  The patient is able to sustain unvoiced fricative up to 20 seconds (only 4 seconds at the evaluation).  Patient instructed in relaxed phonation / oral resonance. Patient reports that her voice sounds more hoarse to herself than it does to me, her vocal loudness is very soft.  The patient and family members have noted that her voice is more clear and louder when she does not have her hearing aids in (she reports uncomfortable feedback if she tries to speak louder). Today, with hearing aids in place, she is able to have 40 minute conversation with improved vocal quality and loudness; she reported that she felt better and sounded less raspy (to herself).        Assessment / Recommendations / Plan   Plan  Continue with current plan of care      Progression Toward Goals   Progression toward goals  Progressing toward goals       SLP Education - 01/04/18 0830    Education provided  Yes    Education Details  continue to try to adjust to speaking louder with hearing aids in place    Person(s) Educated  Patient    Methods  Explanation    Comprehension  Verbalized understanding         SLP Long Term Goals -  12/01/17 [address]   Title  The patient will demonstrate independent understanding of vocal hygiene concepts and extrinsic laryngeal muscle stretches.      Time  8    Period  Weeks    Status  New    Target Date  01/31/18      SLP LONG TERM GOAL #2   Title  The patient will be independent for abdominal breathing and breath support exercises.    Time  8    Period  Weeks    Status  New    Target Date  01/31/18      SLP LONG TERM GOAL #3   Title  The patient will minimize vocal tension via resonant voice therapy (or comparable technique) with min SLP cues with 80% accuracy.    Time  8    Period  Weeks    Status  New    Target Date  01/31/18      SLP LONG  TERM GOAL #4   Title  The patient will maintain relaxed phonation / oral resonance for paragraph length recitation with 80% accuracy.    Time  8    Period  Weeks    Status  New    Target Date  01/31/18       Plan - 01/04/18 0831    Clinical Impression Statement  The patient is able to generate a louder, better quality voice with focus on vocal loudness to reduce laryngeal tension.  She demonstrates ability to detect tense voice and emerging ability to repair tense productions.    Speech Therapy Frequency  2x / week    Duration  Other (comment)    Treatment/Interventions  Other (comment);Patient/family education Voice therapy    Potential to Achieve Goals  Good    Potential Considerations  Ability to learn/carryover information;Severity of impairments;Co-morbidities;Cooperation/participation level;Family/community support;Medical prognosis;Previous level of function    SLP Home Exercise Plan  neck/tongue/throat stretches, breath support exercises, resonant voice exercise    Consulted and Agree with Plan of Care  Patient       Patient will benefit from skilled therapeutic intervention in order to improve the following deficits and impairments:   Dysphonia    Problem List There are no active problems to display for this patient.  Leroy Sea, MS/CCC- SLP  Lou Miner 01/04/2018, 8:32 AM  [place] MAIN [facility] SERVICES [address] [place], [place], [address] Phone: [phone]   Fax:  [phone]   Name: Traci Holland MRN: [id number] Date of Birth: [birth year]

## 2018-01-06 ENCOUNTER — Ambulatory Visit: Payer: Medicare Other | Admitting: Speech Pathology

## 2018-01-06 ENCOUNTER — Encounter: Payer: Self-pay | Admitting: Speech Pathology

## 2018-01-06 DIAGNOSIS — R490 Dysphonia: Secondary | ICD-10-CM

## 2018-01-06 NOTE — Therapy (Signed)
[facility] [address] [place], [place], [address] Phone: [phone]   Fax:  [phone]  Speech Language Pathology Treatment  Patient Details  Name: Traci [facility] MRN: [id number] Date of Birth: 1936-06-19 Referring Provider: Margaretha Sheffield   Encounter Date: 01/06/2018  End of Session - 01/06/18 1455    Visit Number  9    Number of Visits  17    Date for SLP Re-Evaluation  01/31/18    SLP Start Time  1400    SLP Stop Time   1450    SLP Time Calculation (min)  50 min    Activity Tolerance  Patient tolerated treatment well       History reviewed. No pertinent past medical history.  Past Surgical History:  Procedure Laterality Date  . BREAST BIOPSY Left 04/23/2010   bx /clip-neg  . BREAST BIOPSY Right 2010   neg    There were no vitals filed for this visit.  Subjective Assessment - 01/06/18 1454    Subjective  Patient reports (and states son concurs) that her loudness and vocal quality are much better when she does not wear her hearing aids            ADULT SLP TREATMENT - 01/06/18 0001      General Information   Behavior/Cognition  Alert;Cooperative;Pleasant mood    HPI  82 year old woman, with 9 month history of hoarseness, referred by Dr. Kathyrn Sheriff for voice therapy.  Per report, abnormal laryngeal findings include: arytenoid edema, arytenoid erythema, interarytenoid erythema, interarytenoid pachydermia, and false cord voicing.        Treatment Provided   Treatment provided  Cognitive-Linquistic      Pain Assessment   Pain Assessment  No/denies pain      Cognitive-Linquistic Treatment   Treatment focused on  Voice    Skilled Treatment  The patient was provided with written and verbal teaching regarding neck, shoulder, tongue, and throat stretches exercises to promote relaxed phonation. The patient was provided with written and verbal teaching for supplement vocal tract relaxation exercises (straw  phonation).  The patient was provided with written and verbal teaching regarding breath support exercises.  The patient is able to sustain unvoiced fricative up to 20 seconds (only 4 seconds at the evaluation).  Patient instructed in relaxed phonation / oral resonance. Patient reports that her voice sounds more hoarse to herself than it does to me, her vocal loudness is very soft.  The patient and family members have noted that her voice is more clear and louder when she does not have her hearing aids in (she reports uncomfortable feedback if she tries to speak louder). Today, with hearing aids in place, she is able to have 40 minute conversation with improved vocal quality and loudness; she reported that she felt better and sounded less raspy (to herself).        Assessment / Recommendations / Plan   Plan  Continue with current plan of care      Progression Toward Goals   Progression toward goals  Progressing toward goals       SLP Education - 01/06/18 1454    Education provided  Yes    Education Details  continue to try to adjust to speaking louder with hearing aids in place    Person(s) Educated  Patient    Methods  Explanation    Comprehension  Verbalized understanding         SLP Long Term Goals -  12/01/17 [place] #1   Title  The patient will demonstrate independent understanding of vocal hygiene concepts and extrinsic laryngeal muscle stretches.      Time  8    Period  Weeks    Status  New    Target Date  01/31/18      SLP LONG TERM GOAL #2   Title  The patient will be independent for abdominal breathing and breath support exercises.    Time  8    Period  Weeks    Status  New    Target Date  01/31/18      SLP LONG TERM GOAL #3   Title  The patient will minimize vocal tension via resonant voice therapy (or comparable technique) with min SLP cues with 80% accuracy.    Time  8    Period  Weeks    Status  New    Target Date  01/31/18      SLP LONG  TERM GOAL #4   Title  The patient will maintain relaxed phonation / oral resonance for paragraph length recitation with 80% accuracy.    Time  8    Period  Weeks    Status  New    Target Date  01/31/18       Plan - 01/06/18 1455    Clinical Impression Statement  The patient is able to generate a louder, better quality voice with focus on vocal loudness to reduce laryngeal tension.  She demonstrates ability to detect tense voice and emerging ability to repair tense productions.    Speech Therapy Frequency  2x / week    Duration  Other (comment)    Treatment/Interventions  Other (comment);Patient/family education Voice therapy    Potential to Achieve Goals  Good    Potential Considerations  Ability to learn/carryover information;Severity of impairments;Co-morbidities;Cooperation/participation level;Family/community support;Medical prognosis;Previous level of function    SLP Home Exercise Plan  neck/tongue/throat stretches, breath support exercises, resonant voice exercise    Consulted and Agree with Plan of Care  Patient       Patient will benefit from skilled therapeutic intervention in order to improve the following deficits and impairments:   Dysphonia    Problem List There are no active problems to display for this patient.  Leroy Sea, MS/CCC- SLP  Lou Miner 01/06/2018, 2:57 PM  [place] MAIN [facility] SERVICES [address] [place], [place], [address] Phone: [phone]   Fax:  [phone]   Name: Traci [facility] MRN: [id number] Date of Birth: [birth year]

## 2018-01-10 ENCOUNTER — Ambulatory Visit: Payer: Medicare Other | Admitting: Speech Pathology

## 2018-01-10 ENCOUNTER — Encounter: Payer: Self-pay | Admitting: Speech Pathology

## 2018-01-10 DIAGNOSIS — R490 Dysphonia: Secondary | ICD-10-CM | POA: Diagnosis not present

## 2018-01-10 NOTE — Therapy (Signed)
[place] MAIN [facility] SERVICES [address] [place], [place], [address] Phone: [phone]   Fax:  [phone]  Speech Language Pathology Treatment  Patient Details  Name: Traci [facility] MRN: [id number] Date of Birth: 1936/03/30 Referring Provider: Margaretha Sheffield   Encounter Date: 01/10/2018  End of Session - 01/10/18 1527    Visit Number  10    Number of Visits  17    Date for SLP Re-Evaluation  01/31/18    SLP Start Time  1400    SLP Stop Time   1450    SLP Time Calculation (min)  50 min    Activity Tolerance  Patient tolerated treatment well       History reviewed. No pertinent past medical history.  Past Surgical History:  Procedure Laterality Date  . BREAST BIOPSY Left 04/23/2010   bx /clip-neg  . BREAST BIOPSY Right 2010   neg    There were no vitals filed for this visit.  Subjective Assessment - 01/10/18 1526    Subjective  Patient reports (and states son concurs) that her loudness and vocal quality are much better when she does not wear her hearing aids            ADULT SLP TREATMENT - 01/10/18 0001      General Information   Behavior/Cognition  Alert;Cooperative;Pleasant mood    HPI  82 year old woman, with 9 month history of hoarseness, referred by Dr. Kathyrn Sheriff for voice therapy.  Per report, abnormal laryngeal findings include: arytenoid edema, arytenoid erythema, interarytenoid erythema, interarytenoid pachydermia, and false cord voicing.        Treatment Provided   Treatment provided  Cognitive-Linquistic      Pain Assessment   Pain Assessment  No/denies pain      Cognitive-Linquistic Treatment   Treatment focused on  Voice    Skilled Treatment  The patient was provided with written and verbal teaching regarding neck, shoulder, tongue, and throat stretches exercises to promote relaxed phonation. The patient was provided with written and verbal teaching for supplement vocal tract relaxation exercises (straw  phonation).  The patient was provided with written and verbal teaching regarding breath support exercises.  The patient is able to sustain unvoiced fricative up to 20 seconds (only 4 seconds at the evaluation).  Patient instructed in relaxed phonation / oral resonance. Patient reports that her voice sounds more hoarse to herself than it does to me, her vocal loudness is very soft.  The patient and family members have noted that her voice is more clear and louder when she does not have her hearing aids in (she reports uncomfortable feedback if she tries to speak louder). Today, with hearing aids in place, she is able to have 20 minute conversation with improved vocal quality and loudness (average 60 dB); she reported that she felt better and sounded less raspy (to herself).  Patient able to increase loudness to 70 dB, maintaining good quality voice, for reading sentences.      Assessment / Recommendations / Plan   Plan  Continue with current plan of care      Progression Toward Goals   Progression toward goals  Progressing toward goals       SLP Education - 01/10/18 1526    Education provided  Yes    Education Details  read loudly for 2 minutes    Person(s) Educated  Patient    Methods  Explanation    Comprehension  Verbalized understanding  SLP Long Term Goals - 12/01/17 1340      SLP LONG TERM GOAL #1   Title  The patient will demonstrate independent understanding of vocal hygiene concepts and extrinsic laryngeal muscle stretches.      Time  8    Period  Weeks    Status  New    Target Date  01/31/18      SLP LONG TERM GOAL #2   Title  The patient will be independent for abdominal breathing and breath support exercises.    Time  8    Period  Weeks    Status  New    Target Date  01/31/18      SLP LONG TERM GOAL #3   Title  The patient will minimize vocal tension via resonant voice therapy (or comparable technique) with min SLP cues with 80% accuracy.    Time  8    Period   Weeks    Status  New    Target Date  01/31/18      SLP LONG TERM GOAL #4   Title  The patient will maintain relaxed phonation / oral resonance for paragraph length recitation with 80% accuracy.    Time  8    Period  Weeks    Status  New    Target Date  01/31/18       Plan - 01/10/18 1527    Clinical Impression Statement  The patient is able to generate a louder, better quality voice with focus on vocal loudness to reduce laryngeal tension.  She demonstrates ability to detect tense voice and emerging ability to repair tense productions.    Speech Therapy Frequency  2x / week    Duration  Other (comment)    Treatment/Interventions  Other (comment);Patient/family education Voice therapy    Potential Considerations  Ability to learn/carryover information;Severity of impairments;Co-morbidities;Cooperation/participation level;Family/community support;Medical prognosis;Previous level of function    SLP Home Exercise Plan  neck/tongue/throat stretches, breath support exercises, resonant voice exercise, read loudly for 2 minutes everyday    Consulted and Agree with Plan of Care  Patient       Patient will benefit from skilled therapeutic intervention in order to improve the following deficits and impairments:   Dysphonia    Problem List There are no active problems to display for this patient.  Leroy Sea, [place], Susie 01/10/2018, 3:28 PM  [place] MAIN [facility] SERVICES [address] [place], [place], [address] Phone: [phone]   Fax:  [phone]   Name: Traci [facility] MRN: [id number] Date of Birth: [birth year]

## 2018-01-13 ENCOUNTER — Ambulatory Visit: Payer: Medicare Other | Admitting: Speech Pathology

## 2018-01-13 ENCOUNTER — Encounter: Payer: Self-pay | Admitting: Speech Pathology

## 2018-01-13 DIAGNOSIS — R490 Dysphonia: Secondary | ICD-10-CM | POA: Diagnosis not present

## 2018-01-13 NOTE — Therapy (Signed)
[place] MAIN [facility] SERVICES [address] [place], [place], [address] Phone: [phone]   Fax:  [phone]  Speech Language Pathology Treatment  Patient Details  Name: Traci [facility] MRN: [id number] Date of Birth: August 14, 1936 Referring Provider: Margaretha Sheffield   Encounter Date: 01/13/2018  End of Session - 01/13/18 1453    Visit Number  11    Number of Visits  17    Date for SLP Re-Evaluation  01/31/18    SLP Start Time  1400    SLP Stop Time   1453    SLP Time Calculation (min)  53 min    Activity Tolerance  Patient tolerated treatment well       History reviewed. No pertinent past medical history.  Past Surgical History:  Procedure Laterality Date  . BREAST BIOPSY Left 04/23/2010   bx /clip-neg  . BREAST BIOPSY Right 2010   neg    There were no vitals filed for this visit.  Subjective Assessment - 01/13/18 1452    Subjective  Patient reports (and states son concurs) that her loudness and vocal quality are much better when she does not wear her hearing aids            ADULT SLP TREATMENT - 01/13/18 0001      General Information   Behavior/Cognition  Alert;Cooperative;Pleasant mood    HPI  82 year old woman, with 9 month history of hoarseness, referred by Dr. Kathyrn Sheriff for voice therapy.  Per report, abnormal laryngeal findings include: arytenoid edema, arytenoid erythema, interarytenoid erythema, interarytenoid pachydermia, and false cord voicing.        Treatment Provided   Treatment provided  Cognitive-Linquistic      Pain Assessment   Pain Assessment  No/denies pain      Cognitive-Linquistic Treatment   Treatment focused on  Voice    Skilled Treatment  The patient was provided with written and verbal teaching regarding neck, shoulder, tongue, and throat stretches exercises to promote relaxed phonation. The patient was provided with written and verbal teaching for supplement vocal tract relaxation exercises (straw  phonation).  The patient was provided with written and verbal teaching regarding breath support exercises.  The patient is able to sustain unvoiced fricative up to 20 seconds (only 4 seconds at the evaluation).  Patient instructed in relaxed phonation / oral resonance. Patient reports that her voice sounds more hoarse to herself than it does to me, her vocal loudness is very soft.  The patient and family members have noted that her voice is more clear and louder when she does not have her hearing aids in (she reports uncomfortable feedback if she tries to speak louder). Today, with hearing aids in place, she is able to have 40 minute conversation with improved vocal quality and loudness (average 68 dB); she reported that she felt better and sounded less raspy (to herself).  At 40 minutes her voice felt "tired" and she was not able to maintain loudness or vocal clarity.      Assessment / Recommendations / Plan   Plan  Continue with current plan of care      Progression Toward Goals   Progression toward goals  Progressing toward goals       SLP Education - 01/13/18 1453    Education provided  Yes    Education Details  read loudly everyday, give yourself voice rest when needed    Person(s) Educated  Patient    Methods  Explanation  Comprehension  Verbalized understanding         SLP Long Term Goals - 12/01/17 1340      SLP LONG TERM GOAL #1   Title  The patient will demonstrate independent understanding of vocal hygiene concepts and extrinsic laryngeal muscle stretches.      Time  8    Period  Weeks    Status  New    Target Date  01/31/18      SLP LONG TERM GOAL #2   Title  The patient will be independent for abdominal breathing and breath support exercises.    Time  8    Period  Weeks    Status  New    Target Date  01/31/18      SLP LONG TERM GOAL #3   Title  The patient will minimize vocal tension via resonant voice therapy (or comparable technique) with min SLP cues with 80%  accuracy.    Time  8    Period  Weeks    Status  New    Target Date  01/31/18      SLP LONG TERM GOAL #4   Title  The patient will maintain relaxed phonation / oral resonance for paragraph length recitation with 80% accuracy.    Time  8    Period  Weeks    Status  New    Target Date  01/31/18       Plan - 01/13/18 1454    Clinical Impression Statement  The patient is able to generate a louder, better quality voice with focus on vocal loudness to reduce laryngeal tension.  She is able to speak for 40 minutes with louder, better quality voice.    Speech Therapy Frequency  2x / week    Duration  Other (comment)    Treatment/Interventions  Other (comment);Patient/family education Voice therapy    Potential to Achieve Goals  Good    Potential Considerations  Ability to learn/carryover information;Severity of impairments;Co-morbidities;Cooperation/participation level;Family/community support;Medical prognosis;Previous level of function    SLP Home Exercise Plan  neck/tongue/throat stretches, breath support exercises, resonant voice exercise, read loudly for 2 minutes everyday    Consulted and Agree with Plan of Care  Patient       Patient will benefit from skilled therapeutic intervention in order to improve the following deficits and impairments:   Dysphonia    Problem List There are no active problems to display for this patient.  Leroy Sea, MS/CCC- SLP  Lou Miner 01/13/2018, 2:55 PM  [place] MAIN [facility] SERVICES [address] [place], [place], [address] Phone: [phone]   Fax:  [phone]   Name: Traci [facility] MRN: [id number] Date of Birth: Jun 01, 1936

## 2018-01-24 ENCOUNTER — Encounter: Payer: Self-pay | Admitting: *Deleted

## 2018-01-24 MED ORDER — SODIUM CHLORIDE 0.9 % IV SOLN: INTRAVENOUS | Status: DC

## 2018-01-25 ENCOUNTER — Encounter: Payer: Self-pay | Admitting: *Deleted

## 2018-01-25 ENCOUNTER — Ambulatory Visit: Payer: Medicare Other | Admitting: Anesthesiology

## 2018-01-25 ENCOUNTER — Encounter
Admission: RE | Disposition: A | Discharge status: Home / Self Care | Payer: Self-pay | Source: Ambulatory Visit | Attending: Gastroenterology

## 2018-01-25 ENCOUNTER — Ambulatory Visit
Admission: RE | Admit: 2018-01-25 | Discharge: 2018-01-25 | Disposition: A | Discharge status: Home / Self Care | Payer: Medicare Other | Source: Ambulatory Visit | Attending: Gastroenterology | Admitting: Gastroenterology

## 2018-01-25 DIAGNOSIS — I480 Paroxysmal atrial fibrillation: Secondary | ICD-10-CM | POA: Insufficient documentation

## 2018-01-25 DIAGNOSIS — Z1211 Encounter for screening for malignant neoplasm of colon: Secondary | ICD-10-CM | POA: Insufficient documentation

## 2018-01-25 DIAGNOSIS — K210 Gastro-esophageal reflux disease with esophagitis: Secondary | ICD-10-CM | POA: Insufficient documentation

## 2018-01-25 DIAGNOSIS — K635 Polyp of colon: Secondary | ICD-10-CM | POA: Insufficient documentation

## 2018-01-25 DIAGNOSIS — J45909 Unspecified asthma, uncomplicated: Secondary | ICD-10-CM | POA: Diagnosis not present

## 2018-01-25 DIAGNOSIS — Z7901 Long term (current) use of anticoagulants: Secondary | ICD-10-CM | POA: Insufficient documentation

## 2018-01-25 DIAGNOSIS — D123 Benign neoplasm of transverse colon: Secondary | ICD-10-CM | POA: Diagnosis not present

## 2018-01-25 DIAGNOSIS — K449 Diaphragmatic hernia without obstruction or gangrene: Secondary | ICD-10-CM | POA: Diagnosis not present

## 2018-01-25 DIAGNOSIS — K2950 Unspecified chronic gastritis without bleeding: Secondary | ICD-10-CM | POA: Insufficient documentation

## 2018-01-25 DIAGNOSIS — Z79899 Other long term (current) drug therapy: Secondary | ICD-10-CM | POA: Diagnosis not present

## 2018-01-25 DIAGNOSIS — K621 Rectal polyp: Secondary | ICD-10-CM | POA: Insufficient documentation

## 2018-01-25 DIAGNOSIS — Z8673 Personal history of transient ischemic attack (TIA), and cerebral infarction without residual deficits: Secondary | ICD-10-CM | POA: Insufficient documentation

## 2018-01-25 DIAGNOSIS — K224 Dyskinesia of esophagus: Secondary | ICD-10-CM | POA: Diagnosis not present

## 2018-01-25 DIAGNOSIS — K5730 Diverticulosis of large intestine without perforation or abscess without bleeding: Secondary | ICD-10-CM | POA: Diagnosis not present

## 2018-01-25 HISTORY — DX: Paroxysmal atrial fibrillation: I48.0

## 2018-01-25 HISTORY — DX: Personal history of other diseases of the digestive system: Z87.19

## 2018-01-25 HISTORY — DX: Dyspnea, unspecified: R06.00

## 2018-01-25 HISTORY — PX: ESOPHAGOGASTRODUODENOSCOPY (EGD) WITH PROPOFOL: SHX5813

## 2018-01-25 HISTORY — PX: COLONOSCOPY WITH PROPOFOL: SHX5780

## 2018-01-25 HISTORY — DX: Cerebral infarction, unspecified: I63.9

## 2018-01-25 HISTORY — DX: Gastro-esophageal reflux disease without esophagitis: K21.9

## 2018-01-25 HISTORY — DX: Solitary pulmonary nodule: R91.1

## 2018-01-25 HISTORY — DX: Hyperlipidemia, unspecified: E78.5

## 2018-01-25 HISTORY — DX: Rheumatic mitral valve disease, unspecified: I05.9

## 2018-01-25 HISTORY — DX: Unspecified asthma, uncomplicated: J45.909

## 2018-01-25 HISTORY — DX: Headache, unspecified: R51.9

## 2018-01-25 HISTORY — DX: Headache: R51

## 2018-01-25 SURGERY — ESOPHAGOGASTRODUODENOSCOPY (EGD) WITH PROPOFOL
Anesthesia: General

## 2018-01-25 MED ORDER — SODIUM CHLORIDE 0.9 % IV SOLN: INTRAVENOUS | Status: DC

## 2018-01-25 MED ORDER — PROPOFOL 500 MG/50ML IV EMUL
INTRAVENOUS | Status: DC | PRN
  Administered 2018-01-25: 125 ug/kg/min via INTRAVENOUS

## 2018-01-25 MED ORDER — PHENYLEPHRINE HCL 10 MG/ML IJ SOLN
INTRAMUSCULAR | Status: DC | PRN
  Administered 2018-01-25: 100 ug via INTRAVENOUS

## 2018-01-25 MED ORDER — PROPOFOL 500 MG/50ML IV EMUL
INTRAVENOUS | Status: AC
  Filled 2018-01-25: qty 50

## 2018-01-25 MED ORDER — LIDOCAINE HCL (PF) 2 % IJ SOLN
INTRAMUSCULAR | Status: DC | PRN
  Administered 2018-01-25: 100 mg via INTRADERMAL

## 2018-01-25 MED ORDER — PROPOFOL 10 MG/ML IV BOLUS
INTRAVENOUS | Status: DC | PRN
  Administered 2018-01-25: 40 mg via INTRAVENOUS

## 2018-01-25 MED ORDER — IPRATROPIUM-ALBUTEROL 0.5-2.5 (3) MG/3ML IN SOLN: 3.0000 mL | Freq: Once | RESPIRATORY_TRACT | Status: DC

## 2018-01-25 MED ORDER — SODIUM CHLORIDE 0.9 % IV SOLN
INTRAVENOUS | Status: DC
  Administered 2018-01-25: 1000 mL via INTRAVENOUS

## 2018-01-25 MED ORDER — IPRATROPIUM-ALBUTEROL 0.5-2.5 (3) MG/3ML IN SOLN
RESPIRATORY_TRACT | Status: AC
  Administered 2018-01-25: 3 mL
  Filled 2018-01-25: qty 3

## 2018-01-25 NOTE — Anesthesia Preprocedure Evaluation (Signed)
Anesthesia Evaluation  Patient identified by MRN, date of birth, ID band Patient awake    Reviewed: Allergy & Precautions, H&P , NPO status , Patient's Chart, lab work & pertinent test results  History of Anesthesia Complications Negative for: history of anesthetic complications  Airway Mallampati: III  TM Distance: <3 FB Neck ROM: full    Dental  (+) Chipped, Poor Dentition   Pulmonary neg shortness of breath, asthma ,           Cardiovascular Exercise Tolerance: Good (-) angina(-) Past MI and (-) DOE + dysrhythmias Atrial Fibrillation      Neuro/Psych  Headaches, CVA negative psych ROS   GI/Hepatic Neg liver ROS, hiatal hernia, GERD  Medicated and Controlled,  Endo/Other  negative endocrine ROS  Renal/GU negative Renal ROS  negative genitourinary   Musculoskeletal   Abdominal   Peds  Hematology negative hematology ROS (+)   Anesthesia Other Findings Past Medical History: No date: Asthma No date: Dyspnea No date: Elevated lipids No date: GERD (gastroesophageal reflux disease) No date: Headache No date: History of hiatal hernia No date: Lung nodule No date: Mitral valve disorder No date: PAF (paroxysmal atrial fibrillation) (HCC) No date: Stroke [facility])  Past Surgical History: 04/23/2010: BREAST BIOPSY; Left     Comment:  bx /clip-neg 2010: BREAST BIOPSY; Right     Comment:  neg No date: cardiac ablasion No date: COLONOSCOPY No date: HERNIA REPAIR No date: pelvic surgery x2 for post-partum hemmoghage; N/A No date: TONSILLECTOMY No date: TUBAL LIGATION  BMI    Body Mass Index:  24.69 kg/m      Reproductive/Obstetrics negative OB ROS                             Anesthesia Physical Anesthesia Plan  ASA: III  Anesthesia Plan: General   Post-op Pain Management:    Induction: Intravenous  PONV Risk Score and Plan: Propofol infusion and TIVA  Airway Management  Planned: Natural Airway and Nasal Cannula  Additional Equipment:   Intra-op Plan:   Post-operative Plan:   Informed Consent: I have reviewed the patients History and Physical, chart, labs and discussed the procedure including the risks, benefits and alternatives for the proposed anesthesia with the patient or authorized representative who has indicated his/her understanding and acceptance.   Dental Advisory Given  Plan Discussed with: Anesthesiologist, CRNA and Surgeon  Anesthesia Plan Comments: (Patient consented for risks of anesthesia including but not limited to:  - adverse reactions to medications - risk of intubation if required - damage to teeth, lips or other oral mucosa - sore throat or hoarseness - Damage to heart, brain, lungs or loss of life  Patient voiced understanding.)        Anesthesia Quick Evaluation

## 2018-01-25 NOTE — Op Note (Signed)
[facility] Gastroenterology Patient Name: Traci Holland Procedure Date: 01/25/2018 7:49 AM MRN: [id number] Account #: [id number] Date of Birth: 1936/11/05 Admit Type: Outpatient Age: 82 Room: [facility] ENDO ROOM 3 Gender: Female Note Status: Finalized Procedure:            Colonoscopy Indications:          Screening for colorectal malignant neoplasm Providers:            Lollie Sails, MD Referring MD:         Laurice Record. Manor, MD (Referring MD) Medicines:            Monitored Anesthesia Care Complications:        No immediate complications. Procedure:            Pre-Anesthesia Assessment:                       - ASA Grade Assessment: III - A patient with severe                        systemic disease.                       After obtaining informed consent, the colonoscope was                        passed under direct vision. Throughout the procedure,                        the patient's blood pressure, pulse, and oxygen                        saturations were monitored continuously. The Olympus                        PCF-H180AL colonoscope ( S#: [id number] ) was introduced                        through the anus and advanced to the the cecum,                        identified by appendiceal orifice and ileocecal valve.                        The colonoscopy was unusually difficult due to poor                        bowel prep and a tortuous colon. Successful completion                        of the procedure was aided by changing the patient to a                        supine position, changing the patient to a prone                        position and lavage. The patient tolerated the                        procedure well. The quality of the bowel preparation  was fair. Findings:      Multiple medium-mouthed diverticula were found in the sigmoid colon,       descending colon and transverse colon.      Two semi-pedunculated polyps were found in  the mid transverse colon. The       polyps were 4 to 6 mm in size. These polyps were removed with a cold       snare. Resection and retrieval were complete.      A 1 mm polyp was found in the cecum. The polyp was sessile. The polyp       was removed with a cold biopsy forceps. Resection and retrieval were       complete.      A 8 mm polyp was found in the rectum. The polyp was sessile. Polypectomy       was attempted, initially using a cold snare. Polyp resection was       incomplete with this device. This intervention then required a different       device and polypectomy technique. The polyp was removed with a piecemeal       technique using a cold biopsy forceps. Resection and retrieval were       complete.      The exam was otherwise without abnormality.      The digital rectal exam was normal otherwise.      The retroflexed view of the distal rectum and anal verge was normal and       showed no anal or rectal abnormalities. Impression:           - Preparation of the colon was fair.                       - Diverticulosis in the sigmoid colon, in the                        descending colon and in the transverse colon.                       - Two 4 to 6 mm polyps in the mid transverse colon,                        removed with a cold snare. Resected and retrieved.                       - One 1 mm polyp in the cecum, removed with a cold                        biopsy forceps. Resected and retrieved.                       - One 8 mm polyp in the rectum, removed piecemeal using                        a cold biopsy forceps. Resected and retrieved.                       - The examination was otherwise normal.                       - The distal rectum and anal verge are normal on  retroflexion view. Recommendation:       - Discharge patient to home.                       - Full liquid diet for 2 days, then advance as                        tolerated to soft diet for 3  days. Procedure Code(s):    --- Professional ---                       [phone], Colonoscopy, flexible; with removal of tumor(s),                        polyp(s), or other lesion(s) by snare technique                       [id number], 55, Colonoscopy, flexible; with biopsy, single                        or multiple Diagnosis Code(s):    --- Professional ---                       Z12.11, Encounter for screening for malignant neoplasm                        of colon                       D12.3, Benign neoplasm of transverse colon (hepatic                        flexure or splenic flexure)                       D12.0, Benign neoplasm of cecum                       K62.1, Rectal polyp                       K57.30, Diverticulosis of large intestine without                        perforation or abscess without bleeding CPT copyright 2016 American Medical Association. All rights reserved. The codes documented in this report are preliminary and upon coder review may  be revised to meet current compliance requirements. Lollie Sails, MD 01/25/2018 9:11:44 AM This report has been signed electronically. Number of Addenda: 0 Note Initiated On: 01/25/2018 7:49 AM Scope Withdrawal Time: 0 hours 20 minutes 31 seconds  Total Procedure Duration: 0 hours 42 minutes 48 seconds       [facility]

## 2018-01-25 NOTE — Transfer of Care (Signed)
Immediate Anesthesia Transfer of Care Note  Patient: Traci Holland  Procedure(s) Performed: ESOPHAGOGASTRODUODENOSCOPY (EGD) WITH PROPOFOL (N/A ) COLONOSCOPY WITH PROPOFOL (N/A )  Patient Location: PACU  Anesthesia Type:General  Level of Consciousness: awake and sedated  Airway & Oxygen Therapy: Patient Spontanous Breathing and Patient connected to face mask oxygen  Post-op Assessment: Report given to RN and Post -op Vital signs reviewed and stable  Post vital signs: Reviewed and stable  Last Vitals:  Vitals:   01/25/18 0715 01/25/18 0912  BP: (!) 106/91   Pulse: 67   Resp: 20   Temp: (!) 36.2 C (!) 35.9 C  SpO2: 100%     Last Pain:  Vitals:   01/25/18 0912  TempSrc: Tympanic         Complications: No apparent anesthesia complications

## 2018-01-25 NOTE — Anesthesia Postprocedure Evaluation (Signed)
Anesthesia Post Note  Patient: Traci Holland  Procedure(s) Performed: ESOPHAGOGASTRODUODENOSCOPY (EGD) WITH PROPOFOL (N/A ) COLONOSCOPY WITH PROPOFOL (N/A )  Patient location during evaluation: Endoscopy Anesthesia Type: General Level of consciousness: awake and alert Pain management: pain level controlled Vital Signs Assessment: post-procedure vital signs reviewed and stable Respiratory status: spontaneous breathing, nonlabored ventilation, respiratory function stable and patient connected to nasal cannula oxygen Cardiovascular status: blood pressure returned to baseline and stable Postop Assessment: no apparent nausea or vomiting Anesthetic complications: no     Last Vitals:  Vitals:   01/25/18 0912 01/25/18 0942  BP:  126/88  Pulse:    Resp:    Temp: (!) 35.9 C   SpO2:      Last Pain:  Vitals:   01/25/18 0912  TempSrc: Tympanic                 Precious Haws Koehn Salehi

## 2018-01-25 NOTE — H&P (Signed)
Outpatient short stay form Pre-procedure 01/25/2018 7:42 AM Traci Holland  Primary Physician: Dr. [name]  Reason for visit: EGD and colonoscopy  History of present illness: Traci Holland is a 82 year old female presenting today as above.Traci Holland has a history of possible GERD, per recent ent evaluation and vocal hoarseness.  She has no dysphagia.  She is currently taking nexium once daily.  She has held xarelto for over 48 hours.    She takes no other blood thinners or asa products. She tolerated her prep well.     Current Facility-Administered Medications:  .  0.9 %  sodium chloride infusion, , Intravenous, Continuous, Traci Sails, Holland .  0.9 %  sodium chloride infusion, , Intravenous, Continuous, Traci Sails, Holland, Last Rate: 20 mL/hr at 01/25/18 0732, 1,000 mL at 01/25/18 0732 .  0.9 %  sodium chloride infusion, , Intravenous, Continuous, Traci Sails, Holland .  0.9 %  sodium chloride infusion, , Intravenous, Continuous, Traci Sails, Holland .  ipratropium-albuterol (DUONEB) 0.5-2.5 (3) MG/3ML nebulizer solution 3 mL, 3 mL, Nebulization, Once, [place], Precious Haws, Holland  Medications Prior to Admission  Medication Sig Dispense Refill Last Dose  . albuterol (PROVENTIL HFA;VENTOLIN HFA) 108 (90 Base) MCG/ACT inhaler Inhale into the lungs every 6 (six) hours as needed for wheezing or shortness of breath.     . Cholecalciferol (D3 HIGH POTENCY) 2000 units CAPS Take by mouth.     . cyclobenzaprine (FLEXERIL) 10 MG tablet Take 10 mg by mouth 3 (three) times daily as needed for muscle spasms.     . fexofenadine (ALLEGRA) 180 MG tablet Take 180 mg by mouth daily.     . fluticasone (VERAMYST) 27.5 MCG/SPRAY nasal spray Place 2 sprays into the nose daily.     . furosemide (LASIX) 20 MG tablet Take 20 mg by mouth.     Marland Kitchen HYDROcodone-acetaminophen (NORCO/VICODIN) 5-325 MG tablet Take 1 tablet by mouth every 6 (six) hours as needed for moderate pain.     . meclizine (ANTIVERT) 25 MG  tablet Take 25 mg by mouth 3 (three) times daily as needed for dizziness.     . metoprolol succinate (TOPROL-XL) 50 MG 24 hr tablet Take 50 mg by mouth daily. Take with or immediately following a meal.   01/24/2018 at Unknown time  . metoprolol tartrate (LOPRESSOR) 25 MG tablet Take 25 mg by mouth 2 (two) times daily.     . rivaroxaban (XARELTO) 20 MG TABS tablet Take 20 mg by mouth daily with supper.   01/21/2018 at Unknown time  . rosuvastatin (CRESTOR) 20 MG tablet Take 20 mg by mouth daily.        Allergies  Allergen Reactions  . Colcrys [Colchicine] Diarrhea  . Propafenone Palpitations     Past Medical History:  Diagnosis Date  . Asthma   . Dyspnea   . Elevated lipids   . GERD (gastroesophageal reflux disease)   . Headache   . History of hiatal hernia   . Lung nodule   . Mitral valve disorder   . PAF (paroxysmal atrial fibrillation) ([place])   . Stroke [facility])     Review of systems:      Physical Exam    Heart and lungs: Regular rate and rhythm without rub or gallop, lungs are bilaterally clear.    HEENT: Normocephalic atraumatic eyes are anicteric    Other:    Pertinant exam for procedure: Soft nontender nondistended bowel sounds positive normoactive.    Planned proceedures:  EGD, colonoscopy and indicated procedures.  I have discussed the risks benefits and complications of procedures to include not limited to bleeding, infection, perforation and the risk of sedation and the Traci Holland wishes to proceed.  Traci Sails, Holland Gastroenter[facility] 01/25/2018  7:42 AM

## 2018-01-25 NOTE — Op Note (Addendum)
[facility] Gastroenterology Patient Name: Traci Holland Procedure Date: 01/25/2018 7:48 AM MRN: [id number] Account #: [id number] Date of Birth: 1936/10/18 Admit Type: Outpatient Age: 82 Room: [facility] ENDO ROOM 3 Gender: Female Note Status: Finalized Procedure:            Upper GI endoscopy Indications:          Gastro-esophageal reflux disease, hoarseness Providers:            Lollie Sails, MD Referring MD:         Laurice Record. Manor, MD (Referring MD) Medicines:            Monitored Anesthesia Care Complications:        No immediate complications. Procedure:            Pre-Anesthesia Assessment:                       - ASA Grade Assessment: III - A patient with severe                        systemic disease.                       After obtaining informed consent, the endoscope was                        passed under direct vision. Throughout the procedure,                        the patient's blood pressure, pulse, and oxygen                        saturations were monitored continuously. The Endoscope                        was introduced through the mouth, and advanced to the                        second to third part of duodenum. The patient tolerated                        the procedure well. Findings:      LA Grade B (one or more mucosal breaks greater than 5 mm, not extending       between the tops of two mucosal folds) esophagitis with no bleeding was       found. Biopsies were taken with a cold forceps for histology.      A small hiatal hernia was found. The Z-line was a variable distance from       incisors; the hiatal hernia was sliding.      The exam of the esophagus was otherwise normal.      Atypical cystic lesion with large associated blood vessel noted in the       right vallecular area/above the airway.      Diffuse mild inflammation characterized by congestion (edema) and       erythema was found in the entire examined stomach. Biopsies were  taken       with a cold forceps for histology.      The examined duodenum was normal. Impression:           - LA Grade B reflux esophagitis. Biopsied.                       -  Small hiatal hernia.                       - Bile gastritis. Biopsied.                       - Normal examined duodenum. Recommendation:       - Perform a colonoscopy today.                       - Use Protonix (pantoprazole) 40 mg PO daily.                       - Await pathology results.                       - Return to GI clinic in 5 weeks. Procedure Code(s):    --- Professional ---                       [phone], Esophagogastroduodenoscopy, flexible, transoral;                        with biopsy, single or multiple Diagnosis Code(s):    --- Professional ---                       K21.0, Gastro-esophageal reflux disease with esophagitis                       K44.9, Diaphragmatic hernia without obstruction or                        gangrene                       K29.60, Other gastritis without bleeding CPT copyright 2016 American Medical Association. All rights reserved. The codes documented in this report are preliminary and upon coder review may  be revised to meet current compliance requirements. Lollie Sails, MD 01/25/2018 8:21:25 AM This report has been signed electronically. Number of Addenda: 0 Note Initiated On: 01/25/2018 7:48 AM      [facility]

## 2018-01-25 NOTE — Anesthesia Post-op Follow-up Note (Signed)
Anesthesia QCDR form completed.        

## 2018-01-25 NOTE — Anesthesia Procedure Notes (Signed)
Date/Time: 01/25/2018 8:02 AM Performed by: Nelda Marseille, CRNA Pre-anesthesia Checklist: Patient identified, Emergency Drugs available, Suction available, Patient being monitored and Timeout performed Oxygen Delivery Method: Simple face mask

## 2018-01-26 ENCOUNTER — Encounter: Payer: Self-pay | Admitting: Gastroenterology

## 2018-01-26 LAB — SURGICAL PATHOLOGY

## 2018-02-14 ENCOUNTER — Encounter: Payer: Self-pay | Admitting: *Deleted

## 2018-02-14 ENCOUNTER — Other Ambulatory Visit: Payer: Self-pay

## 2018-02-17 NOTE — Anesthesia Preprocedure Evaluation (Addendum)
Anesthesia Evaluation  Patient identified by MRN, date of birth, ID band Patient awake    Reviewed: Allergy & Precautions, NPO status , Patient's Chart, lab work & pertinent test results  History of Anesthesia Complications Negative for: history of anesthetic complications  Airway Mallampati: I  TM Distance: >3 FB Neck ROM: Full    Dental no notable dental hx.    Pulmonary asthma ,    Pulmonary exam normal breath sounds clear to auscultation       Cardiovascular Exercise Tolerance: Good Normal cardiovascular exam+ dysrhythmias (a fib s/p ablation, on Xarelto) + Valvular Problems/Murmurs MVP  Rhythm:Regular Rate:Normal  Echo stress test 09/28/17:  Normal Stress Echocardiogram NORMAL RIGHT VENTRICULAR SYSTOLIC FUNCTION MODERATE VALVULAR REGURGITATION  NO VALVULAR STENOSIS NOTED   Neuro/Psych HOH CVA (2013; residual mild left-sided weakness)    GI/Hepatic GERD  ,  Endo/Other  negative endocrine ROS  Renal/GU negative Renal ROS     Musculoskeletal   Abdominal   Peds  Hematology negative hematology ROS (+)   Anesthesia Other Findings   Reproductive/Obstetrics                            Anesthesia Physical Anesthesia Plan  ASA: III  Anesthesia Plan: General   Post-op Pain Management:    Induction: Intravenous  PONV Risk Score and Plan: 2  Airway Management Planned: Oral ETT  Additional Equipment:   Intra-op Plan:   Post-operative Plan: Extubation in OR  Informed Consent: I have reviewed the patients History and Physical, chart, labs and discussed the procedure including the risks, benefits and alternatives for the proposed anesthesia with the patient or authorized representative who has indicated his/her understanding and acceptance.     Plan Discussed with: CRNA  Anesthesia Plan Comments:        Anesthesia Quick Evaluation

## 2018-02-17 NOTE — Discharge Instructions (Signed)
General Anesthesia, Adult, Care After °These instructions provide you with information about caring for yourself after your procedure. Your health care provider may also give you more specific instructions. Your treatment has been planned according to current medical practices, but problems sometimes occur. Call your health care provider if you have any problems or questions after your procedure. °What can I expect after the procedure? °After the procedure, it is common to have: °· Vomiting. °· A sore throat. °· Mental slowness. ° °It is common to feel: °· Nauseous. °· Cold or shivery. °· Sleepy. °· Tired. °· Sore or achy, even in parts of your body where you did not have surgery. ° °Follow these instructions at home: °For at least 24 hours after the procedure: °· Do not: °? Participate in activities where you could fall or become injured. °? Drive. °? Use heavy machinery. °? Drink alcohol. °? Take sleeping pills or medicines that cause drowsiness. °? Make important decisions or sign legal documents. °? Take care of children on your own. °· Rest. °Eating and drinking °· If you vomit, drink water, juice, or soup when you can drink without vomiting. °· Drink enough fluid to keep your urine clear or pale yellow. °· Make sure you have little or no nausea before eating solid foods. °· Follow the diet recommended by your health care provider. °General instructions °· Have a responsible adult stay with you until you are awake and alert. °· Return to your normal activities as told by your health care provider. Ask your health care provider what activities are safe for you. °· Take over-the-counter and prescription medicines only as told by your health care provider. °· If you smoke, do not smoke without supervision. °· Keep all follow-up visits as told by your health care provider. This is important. °Contact a health care provider if: °· You continue to have nausea or vomiting at home, and medicines are not helpful. °· You  cannot drink fluids or start eating again. °· You cannot urinate after 8-12 hours. °· You develop a skin rash. °· You have fever. °· You have increasing redness at the site of your procedure. °Get help right away if: °· You have difficulty breathing. °· You have chest pain. °· You have unexpected bleeding. °· You feel that you are having a life-threatening or urgent problem. °This information is not intended to replace advice given to you by your health care provider. Make sure you discuss any questions you have with your health care provider. °Document Released: 03/08/2001 Document Revised: 05/04/2016 Document Reviewed: 11/14/2015 °Elsevier Interactive Patient Education © 2018 [employer] ° °

## 2018-02-18 ENCOUNTER — Ambulatory Visit: Payer: Medicare Other | Admitting: Anesthesiology

## 2018-02-18 ENCOUNTER — Ambulatory Visit
Admission: RE | Admit: 2018-02-18 | Discharge: 2018-02-18 | Disposition: A | Discharge status: Home / Self Care | Payer: Medicare Other | Source: Ambulatory Visit | Attending: Otolaryngology | Admitting: Otolaryngology

## 2018-02-18 ENCOUNTER — Encounter
Admission: RE | Disposition: A | Discharge status: Home / Self Care | Payer: Self-pay | Source: Ambulatory Visit | Attending: Otolaryngology

## 2018-02-18 DIAGNOSIS — I69354 Hemiplegia and hemiparesis following cerebral infarction affecting left non-dominant side: Secondary | ICD-10-CM | POA: Diagnosis not present

## 2018-02-18 DIAGNOSIS — Z7901 Long term (current) use of anticoagulants: Secondary | ICD-10-CM | POA: Insufficient documentation

## 2018-02-18 DIAGNOSIS — R490 Dysphonia: Secondary | ICD-10-CM | POA: Diagnosis not present

## 2018-02-18 DIAGNOSIS — K219 Gastro-esophageal reflux disease without esophagitis: Secondary | ICD-10-CM | POA: Diagnosis not present

## 2018-02-18 DIAGNOSIS — J387 Other diseases of larynx: Secondary | ICD-10-CM | POA: Diagnosis not present

## 2018-02-18 DIAGNOSIS — I38 Endocarditis, valve unspecified: Secondary | ICD-10-CM | POA: Insufficient documentation

## 2018-02-18 DIAGNOSIS — Z79899 Other long term (current) drug therapy: Secondary | ICD-10-CM | POA: Diagnosis not present

## 2018-02-18 DIAGNOSIS — J45909 Unspecified asthma, uncomplicated: Secondary | ICD-10-CM | POA: Insufficient documentation

## 2018-02-18 DIAGNOSIS — I4891 Unspecified atrial fibrillation: Secondary | ICD-10-CM | POA: Diagnosis not present

## 2018-02-18 DIAGNOSIS — J392 Other diseases of pharynx: Secondary | ICD-10-CM | POA: Diagnosis present

## 2018-02-18 HISTORY — DX: Presence of external hearing-aid: Z97.4

## 2018-02-18 HISTORY — DX: Adverse effect of unspecified anesthetic, initial encounter: T41.45XA

## 2018-02-18 HISTORY — PX: DIRECT LARYNGOSCOPY: SHX5326

## 2018-02-18 HISTORY — DX: Other complications of anesthesia, initial encounter: T88.59XA

## 2018-02-18 HISTORY — DX: Dizziness and giddiness: R42

## 2018-02-18 SURGERY — LARYNGOSCOPY, DIRECT
Anesthesia: General | Site: Throat | Laterality: Left | Wound class: Clean Contaminated

## 2018-02-18 MED ORDER — DEXTROSE 5 % IV SOLN
2000.0000 mg | Freq: Once | INTRAVENOUS | Status: AC
  Administered 2018-02-18: 2000 mg via INTRAVENOUS

## 2018-02-18 MED ORDER — LACTATED RINGERS IV SOLN: INTRAVENOUS | Status: DC

## 2018-02-18 MED ORDER — PROPOFOL 10 MG/ML IV BOLUS
INTRAVENOUS | Status: DC | PRN
  Administered 2018-02-18: 20 mg via INTRAVENOUS
  Administered 2018-02-18: 150 mg via INTRAVENOUS

## 2018-02-18 MED ORDER — ACETAMINOPHEN 10 MG/ML IV SOLN
1000.0000 mg | Freq: Once | INTRAVENOUS | Status: DC | PRN
  Administered 2018-02-18: 1000 mg via INTRAVENOUS

## 2018-02-18 MED ORDER — LIDOCAINE HCL 1 % IJ SOLN
INTRAMUSCULAR | Status: DC | PRN
  Administered 2018-02-18: 30 mL via TOPICAL

## 2018-02-18 MED ORDER — SUCCINYLCHOLINE CHLORIDE 20 MG/ML IJ SOLN
INTRAMUSCULAR | Status: DC | PRN
  Administered 2018-02-18: 80 mg via INTRAVENOUS

## 2018-02-18 MED ORDER — LIDOCAINE HCL (CARDIAC) 20 MG/ML IV SOLN
INTRAVENOUS | Status: DC | PRN
  Administered 2018-02-18: 40 mg via INTRAVENOUS

## 2018-02-18 MED ORDER — ONDANSETRON HCL 4 MG/2ML IJ SOLN
INTRAMUSCULAR | Status: DC | PRN
  Administered 2018-02-18: 4 mg via INTRAVENOUS

## 2018-02-18 MED ORDER — DEXAMETHASONE SODIUM PHOSPHATE 4 MG/ML IJ SOLN
INTRAMUSCULAR | Status: DC | PRN
  Administered 2018-02-18: 8 mg via INTRAVENOUS

## 2018-02-18 MED ORDER — OXYCODONE HCL 5 MG/5ML PO SOLN
5.0000 mg | Freq: Once | ORAL | Status: AC | PRN
  Administered 2018-02-18: 5 mg via ORAL

## 2018-02-18 MED ORDER — ONDANSETRON HCL 4 MG/2ML IJ SOLN: 4.0000 mg | Freq: Once | INTRAMUSCULAR | Status: DC | PRN

## 2018-02-18 MED ORDER — OXYCODONE HCL 5 MG PO TABS: 5.0000 mg | ORAL_TABLET | Freq: Once | ORAL | Status: AC | PRN

## 2018-02-18 MED ORDER — MIDAZOLAM HCL 5 MG/5ML IJ SOLN
INTRAMUSCULAR | Status: DC | PRN
  Administered 2018-02-18: 1 mg via INTRAVENOUS

## 2018-02-18 MED ORDER — FENTANYL CITRATE (PF) 100 MCG/2ML IJ SOLN: 25.0000 ug | INTRAMUSCULAR | Status: DC | PRN

## 2018-02-18 MED ORDER — ALBUTEROL SULFATE (2.5 MG/3ML) 0.083% IN NEBU
2.5000 mg | INHALATION_SOLUTION | Freq: Once | RESPIRATORY_TRACT | Status: AC
  Administered 2018-02-18: 2.5 mg via RESPIRATORY_TRACT

## 2018-02-18 MED ORDER — LACTATED RINGERS IV SOLN
INTRAVENOUS | Status: DC
  Administered 2018-02-18: 07:00:00 via INTRAVENOUS

## 2018-02-18 MED ORDER — FENTANYL CITRATE (PF) 100 MCG/2ML IJ SOLN
INTRAMUSCULAR | Status: DC | PRN
  Administered 2018-02-18 (×2): 50 ug via INTRAVENOUS

## 2018-02-18 SURGICAL SUPPLY — 22 items
BASIN GRAD PLASTIC 32OZ STRL (MISCELLANEOUS) IMPLANT
BLOCK BITE GUARD (MISCELLANEOUS) ×3 IMPLANT
COVER MAYO STAND STRL (DRAPES) ×3 IMPLANT
COVER TABLE BACK 60X90 (DRAPES) ×3 IMPLANT
CUP MEDICINE 2OZ PLAST GRAD ST (MISCELLANEOUS) IMPLANT
DRAPE SHEET LG 3/4 BI-LAMINATE (DRAPES) ×3 IMPLANT
DRESSING TELFA 4X3 1S ST N-ADH (GAUZE/BANDAGES/DRESSINGS) ×3 IMPLANT
GLOVE PI ULTRA LF STRL 7.5 (GLOVE) ×2 IMPLANT
GLOVE PI ULTRA NON LATEX 7.5 (GLOVE) ×4
KIT TURNOVER KIT A (KITS) ×3 IMPLANT
MARKER SKIN DUAL TIP RULER LAB (MISCELLANEOUS) ×3 IMPLANT
NEEDLE 18GX1X1/2 (RX/OR ONLY) (NEEDLE) ×3 IMPLANT
NEEDLE FILTER BLUNT 18X 1/2SAF (NEEDLE)
NEEDLE FILTER BLUNT 18X1 1/2 (NEEDLE) IMPLANT
NS IRRIG 500ML POUR BTL (IV SOLUTION) IMPLANT
PATTIES SURGICAL .5 X.5 (GAUZE/BANDAGES/DRESSINGS) ×3 IMPLANT
SPONGE XRAY 4X4 16PLY STRL (MISCELLANEOUS) ×3 IMPLANT
STRAP BODY AND KNEE 60X3 (MISCELLANEOUS) ×3 IMPLANT
SYRINGE 10CC LL (SYRINGE) IMPLANT
TOWEL OR 17X26 4PK STRL BLUE (TOWEL DISPOSABLE) ×3 IMPLANT
TUBING CONN 6MMX3.1M (TUBING) ×2
TUBING SUCTION CONN 0.25 STRL (TUBING) ×1 IMPLANT

## 2018-02-18 NOTE — H&P (Signed)
H&P has been reviewedand patient reevaluated,  and no changes necessary. To be downloaded later.  

## 2018-02-18 NOTE — Anesthesia Procedure Notes (Signed)
Procedure Name: Intubation Date/Time: 02/18/2018 8:38 AM Performed by: Cameron Ali, CRNA Pre-anesthesia Checklist: Patient identified, Emergency Drugs available, Suction available, Patient being monitored and Timeout performed Patient Re-evaluated:Patient Re-evaluated prior to induction Oxygen Delivery Method: Circle system utilized Preoxygenation: Pre-oxygenation with 100% oxygen Induction Type: IV induction Ventilation: Mask ventilation without difficulty Grade View: Grade I Tube type: MLT Tube size: 6.0 mm Number of attempts: 1 Airway Equipment and Method: Stylet Placement Confirmation: ETT inserted through vocal cords under direct vision,  positive ETCO2 and breath sounds checked- equal and bilateral Tube secured with: Tape Dental Injury: Teeth and Oropharynx as per pre-operative assessment

## 2018-02-18 NOTE — Transfer of Care (Signed)
Immediate Anesthesia Transfer of Care Note  Patient: Traci Holland  Procedure(s) Performed: DIRECT LARYNGOSCOPY WITH EXCISION LARYNGEAL LESION (Left Throat)  Patient Location: PACU  Anesthesia Type: General  Level of Consciousness: awake, alert  and patient cooperative  Airway and Oxygen Therapy: Patient Spontanous Breathing and Patient connected to supplemental oxygen  Post-op Assessment: Post-op Vital signs reviewed, Patient's Cardiovascular Status Stable, Respiratory Function Stable, Patent Airway and No signs of Nausea or vomiting  Post-op Vital Signs: Reviewed and stable  Complications: No apparent anesthesia complications

## 2018-02-18 NOTE — Op Note (Signed)
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  Surg:  Elon Alas Sabriyah Wilcher  Anes:  GOT  EBL: 15 mL  Comp: None  Findings: The left laryngeal mass had a mucosal attachment to the left side of the epiglottis.  It would flop inside the larynx and outside.  It was whitish and oval with a mucosal covering over it.  It was approximately 1 cm in size.  The left lateral pharyngeal mass was only about 5 mm in size and also appeared to be whitish and fluid-filled, covered by a normal mucosal.    Procedure: The patient was given general anesthesia by oral endotracheal intubation.  When she was asleep and in a supine position a Dedo laryngoscope was used to visualize the hypopharynx.  The upper teeth were protected with a tooth guard.  The vallecula was clear and the epiglottis looked normal but there was a floppy growth attached to the left side of the epiglottis.  This was oval and whitish and had a mucosal covering over it.  It would flop outside the larynx or inside the larynx periodically.  The larynx itself looked normal and the vocal cords look clear.  The piriform sinuses were clear.  The left lateral pharyngeal wall little farther up had a small whitish ball underneath the mucosa as well.  With the Dedo laryngoscope in place in the [place] arm for stabilization.  The endoscope was brought in for magnification of the lesion.  Pictures were taken.  The lesion was grasped near its mucosal stalk and pulled laterally from the epiglottis and then using microlaryngeal scissors the mucosa was cut separating it from the epiglottis.  The cyst was intact and was sent for permanent section.  With  minimal bleeding at the epiglottis.  The scope was repositioned to visualize the left lateral pharyngeal growth.  The endoscope was used for visualizing this area and pictures taken as well.  The mass was lifted up and the microlaryngeal scissors were used again for cutting of the mucosa beneath this to free it up from the underlying tissue.  Again the cyst was intact and sent for permanent section.  There is minimal bleeding in the area and this stopped spontaneously.  No other abnormalities were noted in the hypopharynx or laryngeal area.  Patient was awakened and taken to the recovery room in satisfactory condition.  There were no operative complications.  Dispo:   To PACU to be discharged home  Plan: To follow-up in the office in 1 week to go over pathology report make sure the larynx looks normal.  Huey Romans  02/18/2018 9:06 AM

## 2018-02-18 NOTE — Anesthesia Postprocedure Evaluation (Signed)
Anesthesia Post Note  Patient: Azaria Stegman  Procedure(s) Performed: DIRECT LARYNGOSCOPY WITH EXCISION LARYNGEAL LESION (Left Throat)  Patient location during evaluation: PACU Anesthesia Type: General Level of consciousness: awake and alert, oriented and patient cooperative Pain management: pain level controlled Vital Signs Assessment: post-procedure vital signs reviewed and stable Respiratory status: spontaneous breathing, nonlabored ventilation and respiratory function stable Cardiovascular status: blood pressure returned to baseline and stable Postop Assessment: adequate PO intake Anesthetic complications: no Comments: Albuterol nebulizer treatment given in PACU for slight wheezes throughout fields.  Resolution post-treatment.    Darrin Nipper

## 2018-02-23 LAB — SURGICAL PATHOLOGY

## 2018-06-07 ENCOUNTER — Ambulatory Visit
Admission: EM | Admit: 2018-06-07 | Discharge: 2018-06-07 | Disposition: A | Discharge status: Home / Self Care | Payer: Medicare Other | Attending: Family Medicine | Admitting: Family Medicine

## 2018-06-07 ENCOUNTER — Other Ambulatory Visit: Payer: Self-pay

## 2018-06-07 ENCOUNTER — Other Ambulatory Visit: Payer: Self-pay | Admitting: Gastroenterology

## 2018-06-07 DIAGNOSIS — J069 Acute upper respiratory infection, unspecified: Secondary | ICD-10-CM | POA: Diagnosis not present

## 2018-06-07 DIAGNOSIS — B9789 Other viral agents as the cause of diseases classified elsewhere: Secondary | ICD-10-CM | POA: Diagnosis not present

## 2018-06-07 DIAGNOSIS — R1310 Dysphagia, unspecified: Secondary | ICD-10-CM

## 2018-06-07 MED ORDER — PREDNISONE 50 MG PO TABS: ORAL_TABLET | ORAL | 0 refills | Status: DC

## 2018-06-07 MED ORDER — BENZONATATE 100 MG PO CAPS: 100.0000 mg | ORAL_CAPSULE | Freq: Three times a day (TID) | ORAL | 0 refills | Status: DC | PRN

## 2018-06-07 NOTE — ED Triage Notes (Signed)
Pt reports cough starting last week along with body aches and fatigue after receiving Shingles shot. At first thought it was a reaction to the shot, but now feels as though it is a URI. Productive cough of light colored substance.

## 2018-06-07 NOTE — Discharge Instructions (Signed)
Rest. Fluids.  Steroid as prescribed.  Cough medication if needed.  Take care  Dr. Lacinda Axon

## 2018-06-07 NOTE — ED Provider Notes (Signed)
[facility]  CSN: [id number] Arrival date & time: 06/07/18  1540  History   Chief Complaint Chief Complaint  Patient presents with  . Cough   HPI   82 year old female presents with cough.  Patient reports that she has had recent ongoing cough.  She states that she started to not feel well after she received a shingles vaccine.  She developed body aches, fatigue initially.  This improved, however now she is experiencing cough, hoarseness, and upper respiratory symptoms.  No fever.  Patient states that she just feels poorly.  She has taken some old cough medication with some improvement.  No exacerbating factors. Patient states that she has a lot going on in her personal life.  She recently had a death in the family and her husband has been quite ill and is in skilled nursing facility.  Patient is concerned that she may have an infection and needs antibiotics.  No other associated symptoms.  No other complaints.   Past Medical History:  Diagnosis Date  . Asthma   . Complication of anesthesia    hard to get to sleep then slow to wake for breast biopsy.  Marland Kitchen Dyspnea   . Elevated lipids   . GERD (gastroesophageal reflux disease)   . Headache   . History of hiatal hernia   . Lung nodule   . Mitral valve disorder   . PAF (paroxysmal atrial fibrillation) ([place])    ablation 2013  . Stroke [facility]) 2013   no deficits  . Vertigo    last episode 2-3 yrs ago  . Wears hearing aid in both ears    Past Surgical History:  Procedure Laterality Date  . BREAST BIOPSY Left 04/23/2010   bx /clip-neg  . BREAST BIOPSY Right 2010   neg  . cardiac ablasion    . COLONOSCOPY    . COLONOSCOPY WITH PROPOFOL N/A 01/25/2018   Procedure: COLONOSCOPY WITH PROPOFOL;  Surgeon: Lollie Sails, MD;  Location: [facility] ENDOSCOPY;  Service: Endoscopy;  Laterality: N/A;  . DIRECT LARYNGOSCOPY Left 02/18/2018   Procedure: DIRECT LARYNGOSCOPY WITH EXCISION LARYNGEAL LESION;  Surgeon: Margaretha Sheffield, MD;   Location: [place];  Service: ENT;  Laterality: Left;  . ESOPHAGOGASTRODUODENOSCOPY (EGD) WITH PROPOFOL N/A 01/25/2018   Procedure: ESOPHAGOGASTRODUODENOSCOPY (EGD) WITH PROPOFOL;  Surgeon: Lollie Sails, MD;  Location: [facility] ENDOSCOPY;  Service: Endoscopy;  Laterality: N/A;  . HERNIA REPAIR    . pelvic surgery x2 for post-partum hemmoghage N/A   . TONSILLECTOMY    . TUBAL LIGATION     OB History   None    Home Medications    Prior to Admission medications   Medication Sig Start Date End Date Taking? Authorizing Provider  benzonatate (TESSALON) 100 MG capsule Take 1 capsule (100 mg total) by mouth 3 (three) times daily as needed. 06/07/18   Coral Spikes, DO  Cholecalciferol (D3 HIGH POTENCY) 2000 units CAPS Take by mouth.    [provider]  cyclobenzaprine (FLEXERIL) 10 MG tablet Take 10 mg by mouth 3 (three) times daily as needed for muscle spasms.    [provider]  fexofenadine (ALLEGRA) 180 MG tablet Take 180 mg by mouth daily.    [provider]  fluticasone (VERAMYST) 27.5 MCG/SPRAY nasal spray Place 2 sprays into the nose daily.    [provider]  furosemide (LASIX) 20 MG tablet Take 20 mg by mouth.    [provider]  HYDROcodone-acetaminophen (NORCO/VICODIN) 5-325 MG tablet Take 1  tablet by mouth every 6 (six) hours as needed for moderate pain.    [provider]  meclizine (ANTIVERT) 25 MG tablet Take 25 mg by mouth 3 (three) times daily as needed for dizziness.    [provider]  metoprolol succinate (TOPROL-XL) 50 MG 24 hr tablet Take 50 mg by mouth daily. Take with or immediately following a meal.    [provider]  metoprolol tartrate (LOPRESSOR) 25 MG tablet Take 25 mg by mouth 2 (two) times daily.    [provider]  Multiple Vitamin (MULTIVITAMIN) tablet Take 1 tablet by mouth daily.    [provider]  pantoprazole (PROTONIX) 40 MG tablet Take 40 mg by mouth 2  (two) times daily.    [provider]  predniSONE (DELTASONE) 50 MG tablet 1 tablet daily x 5 days. 06/07/18   Coral Spikes, DO  rivaroxaban (XARELTO) 20 MG TABS tablet Take 20 mg by mouth daily with supper.    [provider]  rosuvastatin (CRESTOR) 20 MG tablet Take 20 mg by mouth daily.    [provider]   Family History Family History  Problem Relation Age of Onset  . Breast cancer Daughter 76   Social History Social History   Tobacco Use  . Smoking status: Never Smoker  . Smokeless tobacco: Never Used  Substance Use Topics  . Alcohol use: No    Frequency: Never  . Drug use: No   Allergies   Colcrys [colchicine] and Propafenone   Review of Systems Review of Systems  Constitutional: Positive for fatigue.  HENT: Positive for voice change.   Respiratory: Positive for cough.    Physical Exam Triage Vital Signs ED Triage Vitals  Enc Vitals Group     BP 06/07/18 1553 (!) 141/66     Pulse Rate 06/07/18 1553 78     Resp 06/07/18 1553 18     Temp 06/07/18 1553 97.7 F (36.5 C)     Temp Source 06/07/18 1553 Oral     SpO2 06/07/18 1553 98 %     Weight 06/07/18 1554 150 lb (68 kg)     Height 06/07/18 1554 5\' 5"  (1.651 m)     Head Circumference --      Peak Flow --      Pain Score 06/07/18 1554 0     Pain Loc --      Pain Edu? --      Excl. in [place]? --    Updated Vital Signs BP (!) 141/66 (BP Location: Left Arm)   Pulse 78   Temp 97.7 F (36.5 C) (Oral)   Resp 18   Ht 5\' 5"  (1.651 m)   Wt 150 lb (68 kg)   LMP  (LMP Unknown)   SpO2 98%   BMI 24.96 kg/m  Physical Exam  Constitutional: She is oriented to person, place, and time. She appears well-developed. No distress.  HENT:  Head: Normocephalic and atraumatic.  Mouth/Throat: Oropharynx is clear and moist.  Cardiovascular: Normal rate and regular rhythm.  Pulmonary/Chest: Effort normal and breath sounds normal. She has no wheezes. She has no rales.  Neurological: She is alert and  oriented to person, place, and time.  Psychiatric: Her behavior is normal.  Flat affect.  Nursing note and vitals reviewed.  UC Treatments / Results  Labs (all labs ordered are listed, but only abnormal results are displayed) Labs Reviewed - No data to display  EKG None  Radiology No results found.  Procedures  Procedures (including critical care time)  Medications Ordered in UC Medications - No data to display  Initial Impression / Assessment and Plan / UC Course  I have reviewed the triage vital signs and the nursing notes.  Pertinent labs & imaging results that were available during my care of the patient were reviewed by me and considered in my medical decision making (see chart for details).    82 year old female presents with an upper respiratory infection with cough.  Given history of asthma, treating with prednisone.  Tessalon Perles for cough.  Supportive care.  Final Clinical Impressions(s) / UC Diagnoses   Final diagnoses:  Viral URI with cough     Discharge Instructions     Rest. Fluids.  Steroid as prescribed.  Cough medication if needed.  Take care  Dr. Lacinda Axon    ED Prescriptions    Medication Sig Dispense Auth. Provider   predniSONE (DELTASONE) 50 MG tablet 1 tablet daily x 5 days. 5 tablet Tristram Milian G, DO   benzonatate (TESSALON) 100 MG capsule Take 1 capsule (100 mg total) by mouth 3 (three) times daily as needed. 30 capsule Coral Spikes, DO     Controlled Substance Prescriptions [place] Controlled Substance Registry consulted? Not Applicable   Coral Spikes, DO 06/07/18 1637

## 2018-07-04 ENCOUNTER — Ambulatory Visit
Admission: RE | Admit: 2018-07-04 | Discharge: 2018-07-04 | Disposition: A | Discharge status: Home / Self Care | Payer: Medicare Other | Source: Ambulatory Visit | Attending: Gastroenterology | Admitting: Gastroenterology

## 2018-07-04 DIAGNOSIS — R1312 Dysphagia, oropharyngeal phase: Secondary | ICD-10-CM

## 2018-07-04 DIAGNOSIS — R1310 Dysphagia, unspecified: Secondary | ICD-10-CM | POA: Diagnosis not present

## 2018-07-04 NOTE — Therapy (Signed)
[address], [place], [address] Phone: [phone]   Fax:     Modified Barium Swallow  Patient Details  Name: Traci Holland MRN: [id number] Date of Birth: [birth year] Referring Provider:    Encounter Date: 07/04/2018  End of Session - 07/04/18 1524    Visit Number  1    Number of Visits  1    Date for SLP Re-Evaluation  07/04/18    SLP Start Time  31    SLP Stop Time   1330    SLP Time Calculation (min)  60 min       Past Medical History:  Diagnosis Date  . Asthma   . Complication of anesthesia    hard to get to sleep then slow to wake for breast biopsy.  Marland Kitchen Dyspnea   . Elevated lipids   . GERD (gastroesophageal reflux disease)   . Headache   . History of hiatal hernia   . Lung nodule   . Mitral valve disorder   . PAF (paroxysmal atrial fibrillation) ([place])    ablation 2013  . Stroke [facility]) 2013   no deficits  . Vertigo    last episode 2-3 yrs ago  . Wears hearing aid in both ears     Past Surgical History:  Procedure Laterality Date  . BREAST BIOPSY Left 04/23/2010   bx /clip-neg  . BREAST BIOPSY Right 2010   neg  . cardiac ablasion    . COLONOSCOPY    . COLONOSCOPY WITH PROPOFOL N/A 01/25/2018   Procedure: COLONOSCOPY WITH PROPOFOL;  Surgeon: Lollie Sails, MD;  Location[facility] ENDOSCOPY;  Service: Endoscopy;  Laterality: N/A;  . DIRECT LARYNGOSCOPY Left 02/18/2018   Procedure: DIRECT LARYNGOSCOPY WITH EXCISION LARYNGEAL LESION;  Surgeon: Margaretha Sheffield, MD;  Location: [place];  Service: ENT;  Laterality: Left;  . ESOPHAGOGASTRODUODENOSCOPY (EGD) WITH PROPOFOL N/A 01/25/2018   Procedure: ESOPHAGOGASTRODUODENOSCOPY (EGD) WITH PROPOFOL;  Surgeon: Lollie Sails, MD;  Location: [facility] ENDOSCOPY;  Service: Endoscopy;  Laterality: N/A;  . HERNIA REPAIR    . pelvic surgery x2 for post-partum hemmoghage N/A   . TONSILLECTOMY    . TUBAL LIGATION      There were no vitals  filed for this visit.   Subjective: Patient behavior: (alertness, ability to follow instructions, etc.): The patient is able to verbalize her swallowing complaints and follow directions  Chief complaint: Patient reports that she has difficulty initiating swallowing at times   Objective:  Radiological Procedure: A videoflouroscopic evaluation of oral-preparatory, reflex initiation, and pharyngeal phases of the swallow was performed; as well as a screening of the upper esophageal phase.  I. POSTURE: Upright in MBS chair  II. VIEW: Lateral  III. COMPENSATORY STRATEGIES: N/A  IV. BOLUSES ADMINISTERED:   Thin Liquid: 1 small, 2 rapid consecutive   Nectar-thick Liquid: 2 moderate   Honey-thick Liquid: DNT   Puree: 2 teaspoon presentations   Mechanical Soft: 1/4 graham cracker in applesauce  V. RESULTS OF EVALUATION: A. ORAL PREPARATORY PHASE: (The lips, tongue, and velum are observed for strength and coordination)       **Overall Severity Rating: Mild; slow posterior transfer with apparent struggle to initiate movement  B. SWALLOW INITIATION/REFLEX: (The reflex is normal if "triggered" by the time the bolus reached the base of the tongue)  **Overall Severity Rating: Mild; triggers while falling from the valleculae to the pyriform sinuses  C. PHARYNGEAL PHASE: (Pharyngeal function is normal if the bolus shows  rapid, smooth, and continuous transit through the pharynx and there is no pharyngeal residue after the swallow)  **Overall Severity Rating: Within normal limits  D. LARYNGEAL PENETRATION: (Material entering into the laryngeal inlet/vestibule but not aspirated) Transient laryngeal penetration with thin liquids   E. ASPIRATION: None  F. ESOPHAGEAL PHASE: (Screening of the upper esophagus): no observed abnormality within the viewable cervical esophagus  ASSESSMENT: This 82 year old woman; S/P excision of left laryngeal (mass mucous retention cyst) and left lateral pharyngeal  mass (mucous retention cyst) and subjective complaint of difficulty initiating swallowing at times; is presenting with mild oropharyngeal dysphagia characterized by delayed initiation of posterior transfer, delayed pharyngeal swallow initiation, and transient laryngeal penetration.  With the exception of delayed initiation of posterior transfer, oral control of the bolus is within normal limits.   Delayed initiation of posterior transfer is consistent with both neuromuscular changes and fear of swallowing.  Delayed pharyngeal swallow initiation is consistent with effects of laryngopharyngeal reflux (inflammation, edema, and resultant decreased sensation of the larynx and pharynx) and neuromuscular changes.  Aspects of the pharyngeal stage of swallowing including tongue base retraction, hyolaryngeal excursion, epiglottic inversion, and duration/amplitude of UES opening are within normal limits.  There is no significant pharyngeal residue or tracheal aspiration.  The patient does not appear to be at significant risk for prandial aspiration.  The patient was counseled that her swallowing is safe.  She may benefit from referral to neurology if any other neurological signs are present.  PLAN/RECOMMENDATIONS:   A. Diet: Regular   B. Swallowing Precautions: small bites/sips, attend to swallowing    C. Recommended consultation to: neurology if other neurological signs are present   D. Therapy recommendations: speech therapy is not indicted at this time   E. Results and recommendations were discussed with the patient immediately following the study and the final report routed to the referring MD   Oropharyngeal dysphagia - Plan: DG OP Swallowing Func-Medicare/Speech Path, DG OP Swallowing Func-Medicare/Speech Path        Problem List There are no active problems to display for this patient.  Leroy Sea, [place], Susie 07/04/2018, 3:25 PM  [facility] [place], [place], [address] Phone: [phone]   Fax:     Name: Traci Holland MRN: [id number] Date of Birth: [birth year]

## 2018-07-28 ENCOUNTER — Ambulatory Visit
Admission: EM | Admit: 2018-07-28 | Discharge: 2018-07-28 | Disposition: A | Discharge status: Home / Self Care | Payer: Medicare Other | Attending: Family Medicine | Admitting: Family Medicine

## 2018-07-28 ENCOUNTER — Ambulatory Visit
Admit: 2018-07-28 | Discharge: 2018-07-28 | Disposition: A | Discharge status: Home / Self Care | Payer: Medicare Other | Attending: Family Medicine | Admitting: Family Medicine

## 2018-07-28 DIAGNOSIS — I480 Paroxysmal atrial fibrillation: Secondary | ICD-10-CM | POA: Insufficient documentation

## 2018-07-28 DIAGNOSIS — Z7901 Long term (current) use of anticoagulants: Secondary | ICD-10-CM | POA: Insufficient documentation

## 2018-07-28 DIAGNOSIS — K219 Gastro-esophageal reflux disease without esophagitis: Secondary | ICD-10-CM | POA: Insufficient documentation

## 2018-07-28 DIAGNOSIS — Z8673 Personal history of transient ischemic attack (TIA), and cerebral infarction without residual deficits: Secondary | ICD-10-CM | POA: Diagnosis not present

## 2018-07-28 DIAGNOSIS — Z79899 Other long term (current) drug therapy: Secondary | ICD-10-CM | POA: Insufficient documentation

## 2018-07-28 DIAGNOSIS — R42 Dizziness and giddiness: Secondary | ICD-10-CM

## 2018-07-28 DIAGNOSIS — J45909 Unspecified asthma, uncomplicated: Secondary | ICD-10-CM | POA: Diagnosis not present

## 2018-07-28 MED ORDER — MECLIZINE HCL 25 MG PO TABS: 25.0000 mg | ORAL_TABLET | Freq: Three times a day (TID) | ORAL | 0 refills | Status: AC | PRN

## 2018-07-28 MED ORDER — DIAZEPAM 2 MG PO TABS: 1.0000 mg | ORAL_TABLET | Freq: Two times a day (BID) | ORAL | 0 refills | Status: DC | PRN

## 2018-07-28 NOTE — ED Triage Notes (Signed)
Pt states she has been dizzy off and on for 1 week. Her head felt funny yesterday and said her hearing has diminished in the past week as well. This episode has been going on since 11am and felt "funny" before it started. Has been taking meclizine and dramamine and still getting worse. No falls or fainting episodes but states she does "feel funny" on the left side of her head, states she didn't notice it until 2 hours ago. Did have a stroke in 2013 on the left side. Said her funny sensation is occurring from her throat up on the left side.

## 2018-07-28 NOTE — Discharge Instructions (Signed)
Meclizine as needed.  If persists, use the diazepam.  See Neurology if persists.  Take care  Dr. Lacinda Axon

## 2018-07-28 NOTE — ED Provider Notes (Signed)
[facility]    CSN: [id number] Arrival date & time: 07/28/18  1628  History   Chief Complaint Chief Complaint  Patient presents with  . Dizziness   HPI  82 year old female presents with the above complaint.  Patient states that her dizziness started on Friday.  She states that it felt like her to go that she is had in the past.  She states that it resolved quickly after taking a dose of meclizine.  Patient states that she has been doing well until last night and this morning.  She states that her head feels funny.  She states that it feels like "pressure".  She has taken additional meclizine as well as Dramamine without resolution.  Today she had vertiginous symptoms described as a room spinning.  Patient states that she just does not feel well.  She had difficulty getting out of bed.  Patient has a history of CVA.  She is on anticoagulation.  Patient is concerned about her persistent symptoms, particular the fact that she has not had resolution with additional doses of meclizine.  No reports of focal weakness.  No other associated symptoms.  No other complaints.   Past Medical History:  Diagnosis Date  . Asthma   . Complication of anesthesia    hard to get to sleep then slow to wake for breast biopsy.  Marland Kitchen Dyspnea   . Elevated lipids   . GERD (gastroesophageal reflux disease)   . Headache   . History of hiatal hernia   . Lung nodule   . Mitral valve disorder   . PAF (paroxysmal atrial fibrillation) ([place])    ablation 2013  . Stroke [facility]) 2013   no deficits  . Vertigo    last episode 2-3 yrs ago  . Wears hearing aid in both ears    Past Surgical History:  Procedure Laterality Date  . BREAST BIOPSY Left 04/23/2010   bx /clip-neg  . BREAST BIOPSY Right 2010   neg  . cardiac ablasion    . COLONOSCOPY    . COLONOSCOPY WITH PROPOFOL N/A 01/25/2018   Procedure: COLONOSCOPY WITH PROPOFOL;  Surgeon: Lollie Sails, MD;  Location: [facility] ENDOSCOPY;  Service: Endoscopy;   Laterality: N/A;  . DIRECT LARYNGOSCOPY Left 02/18/2018   Procedure: DIRECT LARYNGOSCOPY WITH EXCISION LARYNGEAL LESION;  Surgeon: Margaretha Sheffield, MD;  Location: [place];  Service: ENT;  Laterality: Left;  . ESOPHAGOGASTRODUODENOSCOPY (EGD) WITH PROPOFOL N/A 01/25/2018   Procedure: ESOPHAGOGASTRODUODENOSCOPY (EGD) WITH PROPOFOL;  Surgeon: Lollie Sails, MD;  Location[facility] ENDOSCOPY;  Service: Endoscopy;  Laterality: N/A;  . HERNIA REPAIR    . pelvic surgery x2 for post-partum hemmoghage N/A   . TONSILLECTOMY    . TUBAL LIGATION     OB History   None    Home Medications    Prior to Admission medications   Medication Sig Start Date End Date Taking? Authorizing Provider  Cholecalciferol (D3 HIGH POTENCY) 2000 units CAPS Take by mouth.   Yes [provider]  metoprolol succinate (TOPROL-XL) 50 MG 24 hr tablet Take 50 mg by mouth daily. Take with or immediately following a meal.   Yes [provider]  Multiple Vitamin (MULTIVITAMIN) tablet Take 1 tablet by mouth daily.   Yes [provider]  rivaroxaban (XARELTO) 20 MG TABS tablet Take 20 mg by mouth daily with supper.   Yes [provider]  rosuvastatin (CRESTOR) 20 MG tablet Take 20 mg by mouth daily.   Yes [provider]  benzonatate (TESSALON) 100 MG capsule Take 1 capsule (100 mg total) by mouth 3 (three) times daily as needed. 06/07/18   Coral Spikes, DO  cyclobenzaprine (FLEXERIL) 10 MG tablet Take 10 mg by mouth 3 (three) times daily as needed for muscle spasms.    [provider]  diazepam (VALIUM) 2 MG tablet Take 0.5 tablets (1 mg total) by mouth every 12 (twelve) hours as needed for anxiety. 07/28/18   Coral Spikes, DO  fexofenadine (ALLEGRA) 180 MG tablet Take 180 mg by mouth daily.    [provider]  fluticasone (VERAMYST) 27.5 MCG/SPRAY nasal spray Place 2 sprays into the nose daily.    [provider]  furosemide (LASIX) 20 MG tablet Take  20 mg by mouth.    [provider]  HYDROcodone-acetaminophen (NORCO/VICODIN) 5-325 MG tablet Take 1 tablet by mouth every 6 (six) hours as needed for moderate pain.    [provider]  meclizine (ANTIVERT) 25 MG tablet Take 1 tablet (25 mg total) by mouth 3 (three) times daily as needed for dizziness. 07/28/18   Coral Spikes, DO  metoprolol tartrate (LOPRESSOR) 25 MG tablet Take 25 mg by mouth 2 (two) times daily.    [provider]  pantoprazole (PROTONIX) 40 MG tablet Take 40 mg by mouth 2 (two) times daily.    [provider]  predniSONE (DELTASONE) 50 MG tablet 1 tablet daily x 5 days. 06/07/18   Coral Spikes, DO   Family History Family History  Problem Relation Age of Onset  . Breast cancer Daughter 12   Social History Social History   Tobacco Use  . Smoking status: Never Smoker  . Smokeless tobacco: Never Used  Substance Use Topics  . Alcohol use: No    Frequency: Never  . Drug use: No   Allergies   Colcrys [colchicine] and Propafenone  Review of Systems Review of Systems  Constitutional: Negative for chills and fever.  Musculoskeletal: Positive for gait problem.  Neurological: Positive for dizziness. Negative for weakness.   Physical Exam Triage Vital Signs ED Triage Vitals [07/28/18 1639]  Enc Vitals Group     BP (!) 155/77     Pulse Rate 72     Resp 18     Temp 98.3 F (36.8 C)     Temp Source Oral     SpO2 97 %     Weight 150 lb (68 kg)     Height      Head Circumference      Peak Flow      Pain Score 0     Pain Loc      Pain Edu?      Excl. in [place]?    Updated Vital Signs BP (!) 155/77 (BP Location: Left Arm)   Pulse 72   Temp 98.3 F (36.8 C) (Oral)   Resp 18   Wt 68 kg   LMP  (LMP Unknown)   SpO2 97%   BMI 24.96 kg/m   Physical Exam  Constitutional: She is oriented to person, place, and time. She appears well-developed. No distress.  HENT:  Head: Normocephalic and atraumatic.  Cardiovascular: Normal  rate and regular rhythm.  No murmur heard. Pulmonary/Chest: Effort normal and breath sounds normal. She has no wheezes. She has no rales.  Neurological: She is alert and oriented to person, place, and time.  No apparent cranial nerve deficit.  Muscle strength appears to be normal in all extremities. HiNTS exam  revealed nystagmus to the right, no lag with head impulse, and a positive test of skew.  Psychiatric: She has a normal mood and affect. Her behavior is normal.  Nursing note and vitals reviewed.  UC Treatments / Results  Labs (all labs ordered are listed, but only abnormal results are displayed) Labs Reviewed - No data to display  EKG None  Radiology Ct Head Wo Contrast  Result Date: 07/28/2018 CLINICAL DATA:  Dizziness.  Diminished hearing. EXAM: CT HEAD WITHOUT CONTRAST TECHNIQUE: Contiguous axial images were obtained from the base of the skull through the vertex without intravenous contrast. COMPARISON:  09/13/2006 FINDINGS: Brain: No evidence of acute infarction, hemorrhage, hydrocephalus, extra-axial collection or mass lesion/mass effect. Vascular: Mild calcific atherosclerotic disease at the skull base. Skull: Normal. Negative for fracture or focal lesion. Sinuses/Orbits: No acute finding. Other: None. IMPRESSION: No acute intracranial abnormality. Electronically Signed   By: Fidela Salisbury M.D.   On: 07/28/2018 17:31    Procedures Procedures (including critical care time)  Medications Ordered in UC Medications - No data to display  Initial Impression / Assessment and Plan / UC Course  I have reviewed the triage vital signs and the nursing notes.  Pertinent labs & imaging results that were available during my care of the patient were reviewed by me and considered in my medical decision making (see chart for details).    82 year old female presents with dizziness.  Patient has a long-standing history of vertigo and she is also had a prior CVA.  Concern for central  pathology given HiNTS exam.  I discussed this with the patient and patient elected to proceed with CT imaging.  CT negative. Treating with meclizine. Can use valium if no improvement with meclizine. If persists, see Neurology.   Final Clinical Impressions(s) / UC Diagnoses   Final diagnoses:  Vertigo     Discharge Instructions     Meclizine as needed.  If persists, use the diazepam.  See Neurology if persists.  Take care  Dr. Lacinda Axon     ED Prescriptions    Medication Sig Dispense Auth. Provider   meclizine (ANTIVERT) 25 MG tablet Take 1 tablet (25 mg total) by mouth 3 (three) times daily as needed for dizziness. 30 tablet Weylyn Ricciuti G, DO   diazepam (VALIUM) 2 MG tablet Take 0.5 tablets (1 mg total) by mouth every 12 (twelve) hours as needed for anxiety. 10 tablet Coral Spikes, DO     Controlled Substance Prescriptions [place] Controlled Substance Registry consulted? Not Applicable   Coral Spikes, DO 07/28/18 1741

## 2018-08-09 ENCOUNTER — Observation Stay: Payer: Medicare Other

## 2018-08-09 ENCOUNTER — Observation Stay
Admit: 2018-08-09 | Discharge: 2018-08-09 | Disposition: A | Discharge status: Home / Self Care | Payer: Medicare Other | Attending: Internal Medicine | Admitting: Internal Medicine

## 2018-08-09 ENCOUNTER — Emergency Department: Payer: Medicare Other

## 2018-08-09 ENCOUNTER — Observation Stay
Admission: EM | Admit: 2018-08-09 | Discharge: 2018-08-10 | Disposition: A | Discharge status: Home / Self Care | Payer: Medicare Other | Attending: Internal Medicine | Admitting: Internal Medicine

## 2018-08-09 ENCOUNTER — Other Ambulatory Visit: Payer: Self-pay

## 2018-08-09 DIAGNOSIS — Z7951 Long term (current) use of inhaled steroids: Secondary | ICD-10-CM | POA: Insufficient documentation

## 2018-08-09 DIAGNOSIS — R42 Dizziness and giddiness: Secondary | ICD-10-CM | POA: Insufficient documentation

## 2018-08-09 DIAGNOSIS — Z8673 Personal history of transient ischemic attack (TIA), and cerebral infarction without residual deficits: Secondary | ICD-10-CM | POA: Diagnosis not present

## 2018-08-09 DIAGNOSIS — G43909 Migraine, unspecified, not intractable, without status migrainosus: Secondary | ICD-10-CM | POA: Diagnosis not present

## 2018-08-09 DIAGNOSIS — I10 Essential (primary) hypertension: Secondary | ICD-10-CM | POA: Diagnosis not present

## 2018-08-09 DIAGNOSIS — I480 Paroxysmal atrial fibrillation: Secondary | ICD-10-CM | POA: Insufficient documentation

## 2018-08-09 DIAGNOSIS — Z79899 Other long term (current) drug therapy: Secondary | ICD-10-CM | POA: Diagnosis not present

## 2018-08-09 DIAGNOSIS — R270 Ataxia, unspecified: Secondary | ICD-10-CM

## 2018-08-09 DIAGNOSIS — R112 Nausea with vomiting, unspecified: Secondary | ICD-10-CM | POA: Diagnosis not present

## 2018-08-09 DIAGNOSIS — J45909 Unspecified asthma, uncomplicated: Secondary | ICD-10-CM | POA: Diagnosis not present

## 2018-08-09 DIAGNOSIS — R531 Weakness: Secondary | ICD-10-CM | POA: Diagnosis not present

## 2018-08-09 DIAGNOSIS — K219 Gastro-esophageal reflux disease without esophagitis: Secondary | ICD-10-CM | POA: Insufficient documentation

## 2018-08-09 DIAGNOSIS — E785 Hyperlipidemia, unspecified: Secondary | ICD-10-CM | POA: Diagnosis not present

## 2018-08-09 DIAGNOSIS — Z7901 Long term (current) use of anticoagulants: Secondary | ICD-10-CM | POA: Insufficient documentation

## 2018-08-09 DIAGNOSIS — Z888 Allergy status to other drugs, medicaments and biological substances status: Secondary | ICD-10-CM | POA: Diagnosis not present

## 2018-08-09 LAB — CBC WITH DIFFERENTIAL/PLATELET
Basophils Absolute: 0 10*3/uL (ref 0–0.1)
Basophils Relative: 0 %
Eosinophils Absolute: 0.1 10*3/uL (ref 0–0.7)
Eosinophils Relative: 1 %
HEMATOCRIT: 41 % (ref 35.0–47.0)
Hemoglobin: 13.9 g/dL (ref 12.0–16.0)
LYMPHS ABS: 1.7 10*3/uL (ref 1.0–3.6)
LYMPHS PCT: 16 %
MCH: 30.3 pg (ref 26.0–34.0)
MCHC: 33.9 g/dL (ref 32.0–36.0)
MCV: 89.2 fL (ref 80.0–100.0)
MONO ABS: 0.5 10*3/uL (ref 0.2–0.9)
MONOS PCT: 5 %
NEUTROS ABS: 8 10*3/uL — AB (ref 1.4–6.5)
NEUTROS PCT: 78 %
Platelets: 336 10*3/uL (ref 150–440)
RBC: 4.6 MIL/uL (ref 3.80–5.20)
RDW: 13.8 % (ref 11.5–14.5)
WBC: 10.3 10*3/uL (ref 3.6–11.0)

## 2018-08-09 LAB — URINALYSIS, COMPLETE (UACMP) WITH MICROSCOPIC
BACTERIA UA: NONE SEEN
Bilirubin Urine: NEGATIVE
GLUCOSE, UA: NEGATIVE mg/dL
Hgb urine dipstick: NEGATIVE
Ketones, ur: NEGATIVE mg/dL
LEUKOCYTES UA: NEGATIVE
NITRITE: NEGATIVE
PH: 7 (ref 5.0–8.0)
Protein, ur: NEGATIVE mg/dL
SPECIFIC GRAVITY, URINE: 1.004 — AB (ref 1.005–1.030)
SQUAMOUS EPITHELIAL / LPF: NONE SEEN (ref 0–5)

## 2018-08-09 LAB — COMPREHENSIVE METABOLIC PANEL
ALBUMIN: 5 g/dL (ref 3.5–5.0)
ALK PHOS: 61 U/L (ref 38–126)
ALT: 16 U/L (ref 0–44)
ANION GAP: 9 (ref 5–15)
AST: 30 U/L (ref 15–41)
BILIRUBIN TOTAL: 0.7 mg/dL (ref 0.3–1.2)
BUN: 13 mg/dL (ref 8–23)
CALCIUM: 9.8 mg/dL (ref 8.9–10.3)
CO2: 27 mmol/L (ref 22–32)
Chloride: 105 mmol/L (ref 98–111)
Creatinine, Ser: 0.9 mg/dL (ref 0.44–1.00)
GFR calc Af Amer: >60 mL/min (ref >60)
GFR, EST NON AFRICAN AMERICAN: 59 mL/min — AB (ref >60)
GLUCOSE: 108 mg/dL — AB (ref 70–99)
POTASSIUM: 4.1 mmol/L (ref 3.5–5.1)
Sodium: 141 mmol/L (ref 135–145)
TOTAL PROTEIN: 7.7 g/dL (ref 6.5–8.1)

## 2018-08-09 LAB — TROPONIN I

## 2018-08-09 MED ORDER — ROSUVASTATIN CALCIUM 20 MG PO TABS
20.0000 mg | ORAL_TABLET | Freq: Every day | ORAL | Status: DC
  Administered 2018-08-09: 20 mg via ORAL
  Filled 2018-08-09: qty 1

## 2018-08-09 MED ORDER — SODIUM CHLORIDE 0.9 % IV SOLN
INTRAVENOUS | Status: DC
  Administered 2018-08-09 – 2018-08-10 (×2): via INTRAVENOUS

## 2018-08-09 MED ORDER — BENZONATATE 100 MG PO CAPS: 100.0000 mg | ORAL_CAPSULE | Freq: Three times a day (TID) | ORAL | Status: DC | PRN

## 2018-08-09 MED ORDER — LORAZEPAM 2 MG/ML IJ SOLN
1.0000 mg | Freq: Once | INTRAMUSCULAR | Status: AC
  Administered 2018-08-09: 1 mg via INTRAVENOUS
  Filled 2018-08-09: qty 1

## 2018-08-09 MED ORDER — ACETAMINOPHEN 650 MG RE SUPP: 650.0000 mg | RECTAL | Status: DC | PRN

## 2018-08-09 MED ORDER — MECLIZINE HCL 25 MG PO TABS
25.0000 mg | ORAL_TABLET | Freq: Three times a day (TID) | ORAL | Status: DC | PRN
  Administered 2018-08-09: 25 mg via ORAL
  Filled 2018-08-09 (×2): qty 1

## 2018-08-09 MED ORDER — ADULT MULTIVITAMIN W/MINERALS CH
1.0000 | ORAL_TABLET | Freq: Every day | ORAL | Status: DC
  Administered 2018-08-10: 1 via ORAL
  Filled 2018-08-09: qty 1

## 2018-08-09 MED ORDER — METOPROLOL SUCCINATE ER 50 MG PO TB24
50.0000 mg | ORAL_TABLET | Freq: Every day | ORAL | Status: DC
  Administered 2018-08-09: 50 mg via ORAL
  Filled 2018-08-09: qty 1

## 2018-08-09 MED ORDER — RIVAROXABAN 20 MG PO TABS
20.0000 mg | ORAL_TABLET | Freq: Every day | ORAL | Status: DC
  Administered 2018-08-09: 20 mg via ORAL
  Filled 2018-08-09 (×3): qty 1

## 2018-08-09 MED ORDER — PANTOPRAZOLE SODIUM 40 MG PO TBEC
40.0000 mg | DELAYED_RELEASE_TABLET | Freq: Two times a day (BID) | ORAL | Status: DC
  Administered 2018-08-09 – 2018-08-10 (×2): 40 mg via ORAL
  Filled 2018-08-09 (×2): qty 1

## 2018-08-09 MED ORDER — FLUTICASONE PROPIONATE 50 MCG/ACT NA SUSP
2.0000 | Freq: Every day | NASAL | Status: DC
  Filled 2018-08-09: qty 16

## 2018-08-09 MED ORDER — ENOXAPARIN SODIUM 40 MG/0.4ML ~~LOC~~ SOLN: 40.0000 mg | SUBCUTANEOUS | Status: DC

## 2018-08-09 MED ORDER — STROKE: EARLY STAGES OF RECOVERY BOOK: Freq: Once | Status: DC

## 2018-08-09 MED ORDER — SODIUM CHLORIDE 0.9 % IV SOLN
Freq: Once | INTRAVENOUS | Status: AC
  Administered 2018-08-09: 15:00:00 via INTRAVENOUS

## 2018-08-09 MED ORDER — ACETAMINOPHEN 325 MG PO TABS: 650.0000 mg | ORAL_TABLET | ORAL | Status: DC | PRN

## 2018-08-09 MED ORDER — VITAMIN D 1000 UNITS PO TABS
2000.0000 [IU] | ORAL_TABLET | Freq: Every day | ORAL | Status: DC
  Administered 2018-08-10: 2000 [IU] via ORAL
  Filled 2018-08-09: qty 2

## 2018-08-09 MED ORDER — ACETAMINOPHEN 160 MG/5ML PO SOLN
650.0000 mg | ORAL | Status: DC | PRN
  Filled 2018-08-09: qty 20.3

## 2018-08-09 NOTE — Progress Notes (Addendum)
Advanced care plan.  Purpose of the Encounter: CODE STATUS  Parties in Attendance: Patient and daughter Patient's Decision Capacity: Intact  Subjective/Patient's story: Patient is 82 year old with history of htn, atrial fibrillation GERD, previous stroke, asthma presenting with generalized weakness gait instability   Objective/Medical story Discussed with the patient and her daughter regarding her desires for cardiac and pulmonary resuscitation   Goals of care determination:  Full code   CODE STATUS: Full code   Time spent discussing advanced care planning: 16 minutes

## 2018-08-09 NOTE — Plan of Care (Signed)
  Problem: Education: Goal: Knowledge of secondary prevention will improve Outcome: Progressing   Problem: Ischemic Stroke/TIA Tissue Perfusion: Goal: Complications of ischemic stroke/TIA will be minimized Outcome: Progressing   Problem: Education: Goal: Knowledge of General Education information will improve Description Including pain rating scale, medication(s)/side effects and non-pharmacologic comfort measures Outcome: Progressing

## 2018-08-09 NOTE — ED Triage Notes (Signed)
Pt presented today from home via [facility] for and episode of sudden weakness. She has had vertigo for the last 3 weeks. Per daughter, she gets better intermittently and worsens. Hx of stroke. L side deficits from prior stroke. She was in the neurologist office today. She has been under a great deal of stress due to husband's health issues.  Past Medical History:  Diagnosis Date  . Asthma   . Complication of anesthesia    hard to get to sleep then slow to wake for breast biopsy.  Marland Kitchen Dyspnea   . Elevated lipids   . GERD (gastroesophageal reflux disease)   . Headache   . History of hiatal hernia   . Lung nodule   . Mitral valve disorder   . PAF (paroxysmal atrial fibrillation) ([place])    ablation 2013  . Stroke [facility]) 2013   no deficits  . Vertigo    last episode 2-3 yrs ago  . Wears hearing aid in both ears

## 2018-08-09 NOTE — ED Notes (Signed)
MD Williams at bedside.  

## 2018-08-09 NOTE — H&P (Signed)
[facility] [place] NAME: Traci Holland    MR#:  [id number]  DATE OF BIRTH:  1935/12/18  DATE OF ADMISSION:  08/09/2018  PRIMARY CARE PHYSICIAN: Traci Holland., MD   REQUESTING/REFERRING PHYSICIAN: Earleen Newport, MD  CHIEF COMPLAINT:   Chief Complaint  Patient presents with  . Weakness    HISTORY OF PRESENT ILLNESS: Traci Holland  is a 82 y.o. female with a known history of  Asthma, chronic vertigo, hyperlipidemia, GERD, paroxysmal atrial fibrillation, previous stroke who is presenting to the ER with severe dizziness.  Patient earlier today was trying to walk when she started a swaying towards one side and then became very weak and her legs gave way.  She states that this is not like her vertigo.  She has been feeling dizzy recently. Traci Holland denies any asymmetrical weakness or numbness.     PAST MEDICAL HISTORY:   Past Medical History:  Diagnosis Date  . Asthma   . Complication of anesthesia    hard to get to sleep then slow to wake for breast biopsy.  Marland Kitchen Dyspnea   . Elevated lipids   . GERD (gastroesophageal reflux disease)   . Headache   . History of hiatal hernia   . Lung nodule   . Mitral valve disorder   . PAF (paroxysmal atrial fibrillation) ([place])    ablation 2013  . Stroke [facility]) 2013   no deficits  . Vertigo    last episode 2-3 yrs ago  . Wears hearing aid in both ears     PAST SURGICAL HISTORY:  Past Surgical History:  Procedure Laterality Date  . BREAST BIOPSY Left 04/23/2010   bx /clip-neg  . BREAST BIOPSY Right 2010   neg  . cardiac ablasion    . COLONOSCOPY    . COLONOSCOPY WITH PROPOFOL N/A 01/25/2018   Procedure: COLONOSCOPY WITH PROPOFOL;  Surgeon: Lollie Sails, MD;  Location: [facility] ENDOSCOPY;  Service: Endoscopy;  Laterality: N/A;  . DIRECT LARYNGOSCOPY Left 02/18/2018   Procedure: DIRECT LARYNGOSCOPY WITH EXCISION LARYNGEAL LESION;  Surgeon: Margaretha Sheffield, MD;  Location: [place];   Service: ENT;  Laterality: Left;  . ESOPHAGOGASTRODUODENOSCOPY (EGD) WITH PROPOFOL N/A 01/25/2018   Procedure: ESOPHAGOGASTRODUODENOSCOPY (EGD) WITH PROPOFOL;  Surgeon: Lollie Sails, MD;  Location: [facility] ENDOSCOPY;  Service: Endoscopy;  Laterality: N/A;  . HERNIA REPAIR    . pelvic surgery x2 for post-partum hemmoghage N/A   . TONSILLECTOMY    . TUBAL LIGATION      SOCIAL HISTORY:  Social History   Tobacco Use  . Smoking status: Never Smoker  . Smokeless tobacco: Never Used  Substance Use Topics  . Alcohol use: No    Frequency: Never    FAMILY HISTORY:  Family History  Problem Relation Age of Onset  . Breast cancer Daughter 32    DRUG ALLERGIES:  Allergies  Allergen Reactions  . Colcrys [Colchicine] Diarrhea  . Propafenone Palpitations    REVIEW OF SYSTEMS:   CONSTITUTIONAL: No fever, positive fatigue or positive weakness.  EYES: No blurred or double vision.  EARS, NOSE, AND THROAT: No tinnitus or ear pain.  RESPIRATORY: No cough, shortness of breath, wheezing or hemoptysis.  CARDIOVASCULAR: No chest pain, orthopnea, edema.  GASTROINTESTINAL: No nausea, vomiting, diarrhea or abdominal pain.  GENITOURINARY: No dysuria, hematuria.  ENDOCRINE: No polyuria, nocturia,  HEMATOLOGY: No anemia, easy bruising or bleeding SKIN: No rash or lesion. MUSCULOSKELETAL: No joint pain or arthritis.  NEUROLOGIC: No tingling, numbness, weakness.  PSYCHIATRY: No anxiety or depression.   MEDICATIONS AT HOME:  Prior to Admission medications   Medication Sig Start Date End Date Taking? Authorizing Provider  Cholecalciferol (D3 HIGH POTENCY) 2000 units CAPS Take 1 capsule by mouth daily.    Yes [provider]  metoprolol succinate (TOPROL-XL) 50 MG 24 hr tablet Take 50 mg by mouth daily. Take with or immediately following a meal.   Yes [provider]  Multiple Vitamin (MULTIVITAMIN) tablet Take 1 tablet by mouth daily.   Yes [provider]   pantoprazole (PROTONIX) 40 MG tablet Take 40 mg by mouth 2 (two) times daily.   Yes [provider]  rivaroxaban (XARELTO) 20 MG TABS tablet Take 20 mg by mouth daily with supper.   Yes [provider]  rosuvastatin (CRESTOR) 20 MG tablet Take 20 mg by mouth daily.   Yes [provider]  benzonatate (TESSALON) 100 MG capsule Take 1 capsule (100 mg total) by mouth 3 (three) times daily as needed. Patient not taking: Reported on 08/09/2018 06/07/18   Coral Spikes, DO  diazepam (VALIUM) 2 MG tablet Take 0.5 tablets (1 mg total) by mouth every 12 (twelve) hours as needed for anxiety. Patient not taking: Reported on 08/09/2018 07/28/18   Coral Spikes, DO  fluticasone (VERAMYST) 27.5 MCG/SPRAY nasal spray Place 2 sprays into the nose daily.    [provider]  meclizine (ANTIVERT) 25 MG tablet Take 1 tablet (25 mg total) by mouth 3 (three) times daily as needed for dizziness. 07/28/18   Coral Spikes, DO  predniSONE (DELTASONE) 50 MG tablet 1 tablet daily x 5 days. Patient not taking: Reported on 08/09/2018 06/07/18   Coral Spikes, DO      PHYSICAL EXAMINATION:   VITAL SIGNS: Blood pressure (!) 161/83, pulse 69, temperature 97.8 F (36.6 C), temperature source Oral, resp. rate (!) 23, weight 68 kg, SpO2 99 %.  GENERAL:  82 y.o.-year-old patient lying in the bed with no acute distress.  EYES: Pupils equal, round, reactive to light and accommodation. No scleral icterus. Extraocular muscles intact.  HEENT: Head atraumatic, normocephalic. Oropharynx and nasopharynx clear.  NECK:  Supple, no jugular venous distention. No thyroid enlargement, no tenderness.  LUNGS: Normal breath sounds bilaterally, no wheezing, rales,rhonchi or crepitation. No use of accessory muscles of respiration.  CARDIOVASCULAR: S1, S2 normal. No murmurs, rubs, or gallops.  ABDOMEN: Soft, nontender, nondistended. Bowel sounds present. No organomegaly or mass.  EXTREMITIES: No pedal edema,  cyanosis, or clubbing.  NEUROLOGIC: Bilateral lower extremity weakness PSYCHIATRIC: The patient is alert and oriented x 3.  SKIN: No obvious rash, lesion, or ulcer.   LABORATORY PANEL:   CBC Recent Labs  Lab 08/09/18 1411  WBC 10.3  HGB 13.9  HCT 41.0  PLT 336  MCV 89.2  MCH 30.3  MCHC 33.9  RDW 13.8  LYMPHSABS 1.7  MONOABS 0.5  EOSABS 0.1  BASOSABS 0.0   ------------------------------------------------------------------------------------------------------------------  Chemistries  Recent Labs  Lab 08/09/18 1411  NA 141  K 4.1  CL 105  CO2 27  GLUCOSE 108*  BUN 13  CREATININE 0.90  CALCIUM 9.8  AST 30  ALT 16  ALKPHOS 61  BILITOT 0.7   ------------------------------------------------------------------------------------------------------------------ estimated creatinine clearance is 44.9 mL/min (by C-G formula based on SCr of 0.9 mg/dL). ------------------------------------------------------------------------------------------------------------------ No results for input(s): TSH, T4TOTAL, T3FREE, THYROIDAB in the last 72 hours.  Invalid input(s): FREET3   Coagulation profile No results for  input(s): INR, PROTIME in the last 168 hours. ------------------------------------------------------------------------------------------------------------------- No results for input(s): DDIMER in the last 72 hours. -------------------------------------------------------------------------------------------------------------------  Cardiac Enzymes Recent Labs  Lab 08/09/18 1411  TROPONINI <0.03   ------------------------------------------------------------------------------------------------------------------ Invalid input(s): POCBNP  ---------------------------------------------------------------------------------------------------------------  Urinalysis No results found for: COLORURINE, APPEARANCEUR, LABSPEC, PHURINE, GLUCOSEU, HGBUR, BILIRUBINUR, KETONESUR,  PROTEINUR, UROBILINOGEN, NITRITE, LEUKOCYTESUR   RADIOLOGY: Ct Head Wo Contrast  Result Date: 08/09/2018 CLINICAL DATA:  82 year old female with sudden onset weakness today, vertigo for the past three weeks. EXAM: CT HEAD WITHOUT CONTRAST TECHNIQUE: Contiguous axial images were obtained from the base of the skull through the vertex without intravenous contrast. COMPARISON:  Head CT without contrast 07/28/2018, 09/13/2006. FINDINGS: Brain: Cerebral volume is normal for age. No midline shift, ventriculomegaly, mass effect, evidence of mass lesion, intracranial hemorrhage or evidence of cortically based acute infarction. Minimal to mild for age scattered white matter hypodensity with otherwise normal gray-white matter differentiation throughout the brain. No cortical encephalomalacia identified. Vascular: Calcified atherosclerosis at the skull base. No suspicious intracranial vascular hyperdensity. Skull: Negative. Sinuses/Orbits: Visualized paranasal sinuses and mastoids are stable and well pneumatized. Other: No acute orbit or scalp soft tissue finding. IMPRESSION: Stable and normal for age noncontrast Head CT. Electronically Signed   By: Genevie Ann M.D.   On: 08/09/2018 14:40    EKG: Orders placed or performed during the hospital encounter of 08/09/18  . EKG 12-Lead  . EKG 12-Lead    IMPRESSION AND PLAN: Patient is a 82 year old white female presenting with generalized weakness gait instability  1.  Generalized weakness and gait instability with previous history of stroke We will place patient on observation obtain MRI MRI of the brain check carotid Dopplers and echo of the heart Continue therapy with Xarelto I will place on low-dose aspirin Neurology input PT evaluation  2.  Hyperlipidemia continue Crestor  3.  Hypertension continue Toprol-XL  4.  Chronic vertigo continue Antivert  5.  History of atrial fibrillation continue Xarelto and Toprol-XL  All the records are reviewed and case  discussed with ED provider. Management plans discussed with the patient, family and they are in agreement.  CODE STATUS: Full'    TOTAL TIME TAKING CARE OF THIS PATIENT: 55 minutes.    Dustin Flock M.D on 08/09/2018 at 3:34 PM  Between 7am to 6pm - Pager - [phone]  After 6pm go to [email or web address] - password [email or web address]  [employer] Office  [phone]  CC: Primary care physician; Traci Holland., MD

## 2018-08-09 NOTE — ED Provider Notes (Addendum)
[facility] Emergency Department Provider Note       Time seen: ----------------------------------------- 2:06 PM on 08/09/2018 -----------------------------------------   I have reviewed the triage vital signs and the nursing notes.  HISTORY   Chief Complaint Weakness    HPI Traci Holland is a 82 y.o. female with a history of hyperlipidemia, headaches, atrial fibrillation, CVA and vertigo who presents to the ED for dizziness.  Patient arrives by EMS for an episode of sudden weakness.  She had to go for the last 3 weeks but states did not have vertigo today.  Daughter states she gets better intermittently and then worsens.  She does have a history of stroke in the past.  She has been under a great deal of stress due to her husband's health issues.  She states she has been eating and drinking well with no recent change in her medicines.  Past Medical History:  Diagnosis Date  . Asthma   . Complication of anesthesia    hard to get to sleep then slow to wake for breast biopsy.  Marland Kitchen Dyspnea   . Elevated lipids   . GERD (gastroesophageal reflux disease)   . Headache   . History of hiatal hernia   . Lung nodule   . Mitral valve disorder   . PAF (paroxysmal atrial fibrillation) ([place])    ablation 2013  . Stroke [facility]) 2013   no deficits  . Vertigo    last episode 2-3 yrs ago  . Wears hearing aid in both ears     There are no active problems to display for this patient.   Past Surgical History:  Procedure Laterality Date  . BREAST BIOPSY Left 04/23/2010   bx /clip-neg  . BREAST BIOPSY Right 2010   neg  . cardiac ablasion    . COLONOSCOPY    . COLONOSCOPY WITH PROPOFOL N/A 01/25/2018   Procedure: COLONOSCOPY WITH PROPOFOL;  Surgeon: Lollie Sails, MD;  Location[facility] ENDOSCOPY;  Service: Endoscopy;  Laterality: N/A;  . DIRECT LARYNGOSCOPY Left 02/18/2018   Procedure: DIRECT LARYNGOSCOPY WITH EXCISION LARYNGEAL LESION;  Surgeon: Margaretha Sheffield, MD;   Location: [place];  Service: ENT;  Laterality: Left;  . ESOPHAGOGASTRODUODENOSCOPY (EGD) WITH PROPOFOL N/A 01/25/2018   Procedure: ESOPHAGOGASTRODUODENOSCOPY (EGD) WITH PROPOFOL;  Surgeon: Lollie Sails, MD;  Location[facility] ENDOSCOPY;  Service: Endoscopy;  Laterality: N/A;  . HERNIA REPAIR    . pelvic surgery x2 for post-partum hemmoghage N/A   . TONSILLECTOMY    . TUBAL LIGATION      Allergies Colcrys [colchicine] and Propafenone  Social History Social History   Tobacco Use  . Smoking status: Never Smoker  . Smokeless tobacco: Never Used  Substance Use Topics  . Alcohol use: No    Frequency: Never  . Drug use: No   Review of Systems Constitutional: Negative for fever. Cardiovascular: Negative for chest pain. Respiratory: Negative for shortness of breath. Gastrointestinal: Negative for abdominal pain, vomiting and diarrhea. Musculoskeletal: Negative for back pain. Skin: Negative for rash. Neurological: Positive for generalized weakness  All systems negative/normal/unremarkable except as stated in the HPI  ____________________________________________   PHYSICAL EXAM:  VITAL SIGNS: ED Triage Vitals  Enc Vitals Group     BP --      Pulse --      Resp --      Temp --      Temp src --      SpO2 --      Weight 08/09/18 1405  149 lb 14.6 oz (68 kg)     Height --      Head Circumference --      Peak Flow --      Pain Score 08/09/18 1404 0     Pain Loc --      Pain Edu? --      Excl. in [place]? --    Constitutional: Alert and oriented. Well appearing and in no distress. Eyes: Conjunctivae are normal. Normal extraocular movements. ENT   Head: Normocephalic and atraumatic.   Nose: No congestion/rhinnorhea.   Mouth/Throat: Mucous membranes are moist.   Neck: No stridor. Cardiovascular: Normal rate, regular rhythm. No murmurs, rubs, or gallops. Respiratory: Normal respiratory effort without tachypnea nor retractions. Breath sounds are clear  and equal bilaterally. No wheezes/rales/rhonchi. Gastrointestinal: Soft and nontender. Normal bowel sounds Musculoskeletal: Nontender with normal range of motion in extremities. No lower extremity tenderness nor edema. Neurologic:  Normal speech and language. No gross focal neurologic deficits are appreciated.  Strength, sensation, cranial nerves appear to be normal Skin:  Skin is warm, dry and intact. No rash noted. Psychiatric: Depressed mood and affect ____________________________________________  EKG: Interpreted by me.  Sinus rhythm the rate of 70 bpm, low voltage, normal axis, normal QT  ____________________________________________  ED COURSE:  As part of my medical decision making, I reviewed the following data within the [place] History obtained from family if available, nursing notes, old chart and ekg, as well as notes from prior ED visits. Patient presented for generalized weakness, we will assess with labs and imaging as indicated at this time.   Procedures ____________________________________________   LABS (pertinent positives/negatives)  Labs Reviewed  CBC WITH DIFFERENTIAL/PLATELET - Abnormal; Notable for the following components:      Result Value   Neutro Abs 8.0 (*)    All other components within normal limits  COMPREHENSIVE METABOLIC PANEL - Abnormal; Notable for the following components:   Glucose, Bld 108 (*)    GFR calc non Af Amer 59 (*)    All other components within normal limits  TROPONIN I  URINALYSIS, COMPLETE (UACMP) WITH MICROSCOPIC  CBG MONITORING, ED    RADIOLOGY Images were viewed by me  CT head IMPRESSION: Stable and normal for age noncontrast Head CT.  ____________________________________________  DIFFERENTIAL DIAGNOSIS   Dehydration, electrolyte abnormality, occult infection, depression, anxiety, vertigo, orthostasis  FINAL ASSESSMENT AND PLAN  Ataxia, weakness   Plan: The patient had presented for  weakness and dizziness. Patient's labs are still pending at this time. Patient's imaging was negative for any acute process.  Patient has been evaluated today by neurology and was sent home and then subsequently had worsening of her symptoms.  We agreed that the next step would be full stroke work-up in the hospital.    Laurence Aly, MD   Note: This note was generated in part or whole with voice recognition software. Voice recognition is usually quite accurate but there are transcription errors that can and very often do occur. I apologize for any typographical errors that were not detected and corrected.     Earleen Newport, MD 08/09/18 1458    Earleen Newport, MD 08/09/18 [phone]

## 2018-08-09 NOTE — ED Notes (Signed)
Pt back from CT

## 2018-08-09 NOTE — Progress Notes (Signed)
Anticoag Monitoring: Patient with orders for Xarelto 20mg  daily and Lovenox 40mg  SQ q24h. Discontinued Lovenox per protocol. Patient on full dose anticoagulant with Xarelto.  Paulina Fusi, PharmD, BCPS 08/09/2018 6:01 PM

## 2018-08-09 NOTE — ED Notes (Signed)
Patient transported to CT 

## 2018-08-10 ENCOUNTER — Observation Stay: Payer: Medicare Other

## 2018-08-10 DIAGNOSIS — R270 Ataxia, unspecified: Secondary | ICD-10-CM | POA: Diagnosis not present

## 2018-08-10 DIAGNOSIS — R531 Weakness: Secondary | ICD-10-CM | POA: Diagnosis not present

## 2018-08-10 LAB — LIPID PANEL
CHOLESTEROL: 150 mg/dL (ref 0–200)
HDL: 39 mg/dL — AB (ref >40)
LDL CALC: 85 mg/dL (ref 0–99)
TRIGLYCERIDES: 132 mg/dL (ref <150)
Total CHOL/HDL Ratio: 3.8 RATIO
VLDL: 26 mg/dL (ref 0–40)

## 2018-08-10 LAB — HEMOGLOBIN A1C
HEMOGLOBIN A1C: 5.3 % (ref 4.8–5.6)
Mean Plasma Glucose: 105.41 mg/dL

## 2018-08-10 LAB — ECHOCARDIOGRAM COMPLETE: WEIGHTICAEL: 2398.6 [oz_av]

## 2018-08-10 NOTE — Progress Notes (Addendum)
Physical Therapy Evaluation Patient Details Name: Traci Holland MRN: [id number] DOB: 1936-11-09 Today's Date: 08/10/2018   History of Present Illness  Traci Holland  is a 82 y.o. female with a known history of Asthma, chronic vertigo, hyperlipidemia, GERD, paroxysmal atrial fibrillation, previous stroke who is presenting to the ER with severe dizziness.  Patient earlier today was trying to walk when she started a swaying towards one side and then became very weak and her legs gave way.  She states that this is not like her vertigo.  She has been feeling dizzy recently. Pt reports history of intermittent vertigo for multiple years. She denies any lightheadedness or syncope during these episodes. She denies numbness/tingling or focal weakness but does complain of general UE/LE weakness. Pt reports history of bilateral tinnitus for multiple years. She wears bilateral hearing aids and sees the audiologist at [facility] but denies every being worked up for her vertigo there. She is unable to tell if her hearing has been worsening during these episodes. Pt reports some mild intermittent L ear pain but denies any drainage. She reports some mild clouding/blurring of her vision when these episodes occur. She has also had slurred speech and possible facial drooping with these episodes in the past. Pt reports a history of migraines for multiple years. She had not had any migraines for 2-3 months and then started to have them daily approximately 3 weeks ago. When her episode of dizziness started yesterday she did not have a headache initially but states that after her symptoms resolved she started with a headache. She reports sensitivity to light with her migraines. In addition she has been under added stress as her husband is now in a skilled nursing facility with dementia. Per patient, it has been suggested to involve palliative care and she has been told he only has a few weeks/months left to live.   Clinical  Impression  Pt admitted with above diagnosis. Pt currently with functional limitations due to the deficits listed below (see PT Problem List). Pt is independent with bed mobility and supervision for transfers. Pt stands up the first time and then has to sit right back down due to leg weakness. With second transfer she is steady in standing with UE support on rolling walker. Pt is able to complete 2 laps around RN station with therapist. She is steady with rolling walker and is able to perform horizontal and vertical head turns without lateral gait deviation or imbalance. Vitals are stable with ambulation and pt denies DOE, chest pain, or heart palpitations. No lightheadedness or dizziness. Pt is able to maintain balance with feet together/apart. Positive Romberg. Single leg balance is approximately 4-5 seconds on each side. EOM intact and facial strength is full. Palate elevates symmetrically and tongue protrusion midline. Shoulder shrug is strong. Finger to nose is mildly abnormal on the R. Heel to shin is normal as well as rapid alternating movements and finger opposition. Pt with mildly saccadic smooth pursuits but horizontal saccade testing is accurate. VOR testing negative but mildly positive VOR thrust to the left. VOR cancellation negative. Negative Dix-Hallpike testing bilaterally. No focal deficits in strength or sensation. Only significant peripheral finding is a positive head thrust to the left. Some possible etiologies in the differential would include Meniere's disease and vestibular migraines. She would benefit from work-up by ENT if symptoms persist and no other obvious source is identified. Pt is safe to return home. Recommend use of rolling walker at discharge. She would benefit from OP PT  for vestibular therapy pending consult by ENT. Pt will benefit from PT services to address deficits in strength, balance, and mobility in order to return to full function at home.    Follow Up Recommendations  Outpatient PT;Other (comment)(Vestibular therapy)    Equipment Recommendations  Other (comment);3in1 (PT)(PT advised to utilize rolling walker at discharge)    Recommendations for Other Services       Precautions / Restrictions Precautions Precautions: Fall Restrictions Weight Bearing Restrictions: No      Mobility  Bed Mobility Overal bed mobility: Independent             General bed mobility comments: Fair speed and sequencing. No assist required  Transfers Overall transfer level: Needs assistance Equipment used: Rolling walker (2 wheeled) Transfers: Sit to/from Stand Sit to Stand: Min guard         General transfer comment: Pt stands up the first time and then has to sit right back down due to leg weakness. With second transfer she is steady in standing with UE support on rolling walker  Ambulation/Gait Ambulation/Gait assistance: Min guard Gait Distance (Feet): 200 Feet Assistive device: Rolling walker (2 wheeled) Gait Pattern/deviations: Step-through pattern     General Gait Details: Pt is able to complete 2 laps around RN station with therapist. She is steady with rolling walker and is able to perform horizontal and vertical head turns without lateral gait deviation or imbalance. Vitals are stable with ambulation and pt denies DOE, chest pain, or heart palpitations. No lightheadedness or dizziness  Stairs            Wheelchair Mobility    Modified Rankin (Stroke Patients Only)       Balance Overall balance assessment: Needs assistance Sitting-balance support: No upper extremity supported Sitting balance-Leahy Scale: Normal     Standing balance support: No upper extremity supported Standing balance-Leahy Scale: Good Standing balance comment: Able to maintain balance with feet together/apart. Positive Romberg. Single leg balance is approximately 4-5 seconds on each side.                              Pertinent Vitals/Pain Pain  Assessment: No/denies pain    Home Living Family/patient expects to be discharged to:: Private residence Living Arrangements: Alone Available Help at Discharge: Family Type of Home: House Home Access: Stairs to enter Entrance Stairs-Rails: Can reach both Entrance Stairs-Number of Steps: 2 Home Layout: Multi-level;Able to live on main level with bedroom/bathroom Home Equipment: Gilford Rile - 2 wheels;Shower seat - built in(no grab bars in shower)      Prior Function Level of Independence: Independent         Comments: Previously ambulatory without assistive device for community distances. No falls. Drives. Independent with ADLs/IADLs     Hand Dominance   Dominant Hand: Right    Extremity/Trunk Assessment   Upper Extremity Assessment Upper Extremity Assessment: Overall WFL for tasks assessed    Lower Extremity Assessment Lower Extremity Assessment: Overall WFL for tasks assessed       Communication   Communication: No difficulties  Cognition Arousal/Alertness: Awake/alert Behavior During Therapy: WFL for tasks assessed/performed Overall Cognitive Status: Within Functional Limits for tasks assessed                                        General Comments      Exercises  Assessment/Plan    PT Assessment Patient needs continued PT services  PT Problem List Decreased activity tolerance;Decreased balance;Decreased mobility       PT Treatment Interventions DME instruction;Gait training;Stair training;Functional mobility training;Therapeutic activities;Therapeutic exercise;Balance training;Neuromuscular re-education    PT Goals (Current goals can be found in the Care Plan section)  Acute Rehab PT Goals Patient Stated Goal: Return to prior function at home PT Goal Formulation: With patient Time For Goal Achievement: 08/24/18 Potential to Achieve Goals: Good    Frequency Min 2X/week   Barriers to discharge Decreased caregiver support Lives  alone    Co-evaluation               AM-PAC PT "6 Clicks" Daily Activity  Outcome Measure Difficulty turning over in bed (including adjusting bedclothes, sheets and blankets)?: A Little Difficulty moving from lying on back to sitting on the side of the bed? : A Little Difficulty sitting down on and standing up from a chair with arms (e.g., wheelchair, bedside commode, etc,.)?: A Little Help needed moving to and from a bed to chair (including a wheelchair)?: A Little Help needed walking in hospital room?: A Little Help needed climbing 3-5 steps with a railing? : A Little 6 Click Score: 18    End of Session Equipment Utilized During Treatment: Gait belt Activity Tolerance: Patient tolerated treatment well Patient left: in bed;with call bell/phone within reach;with bed alarm set   PT Visit Diagnosis: Unsteadiness on feet (R26.81);Muscle weakness (generalized) (M62.81);Dizziness and giddiness (R42)    Time: 7414-2395 PT Time Calculation (min) (ACUTE ONLY): 60 min   Charges:   PT Evaluation $PT Eval Moderate Complexity: 1 Mod PT Treatments $Therapeutic Activity: 8-22 mins        Lyndel Safe Janicia Monterrosa PT, DPT, GCS  Tovia Kisner 08/10/2018, 1:44 PM

## 2018-08-10 NOTE — Consult Note (Signed)
Referring Physician: Dustin Flock, MD    Chief Complaint: Dizziness  HPI: Traci Holland is an 82 y.o. female female with a known history of Asthma, chronic vertigo, hyperlipidemia, GERD, paroxysmal atrial fibrillation status post ablation, previous stroke who is presenting to the ER with severe dizziness concerning for possible stroke. She report that dizziness is intermittent and that she has had this episode for several years. However in the last 3 weeks, she has had recurring spells of what she thinks is a combination of dizziness and vertigo. She describes dizziness as a sense of unsteady gait and instability associated with lightheadedness, and vertigo as a spinning sensation associated with nausea and vomiting. She has history of migraine headaches that can be associated with or without dizziness. She state that sometimes when she has dizziness, a headache will follow. She describes episodic vertigo with signs of migraine plus photophobia without aura during at least two episodes of dizziness. She denies associated altered sensorium, speech abnormality, cranial nerve deficit, seizures, focal motor or sensory deficits, diplopia, syncope or LOC, paresthesia (numbness, tingling, pins-and-needles sensation) or a heavy feeling in an extremity. She however endorses feeling of lightheadedness  with clumsiness as if she is swaying to the sides when walking. Patient state she has hearing difficulties and has been evaluated by audiologist. She is has also been seen by Neurology Dr. Melrose Nakayama for Dizziness and was referred to vestibular rehab.  She also mentions that she has been under significant stress due to her husband who has terminal illness and is currently in hospice. Initial CT head was negative. Due to her history of stroke and stroke risk factors, she was admitted for full stroke work up.    Past Medical History:  Diagnosis Date  . Asthma   . Complication of anesthesia    hard to get to sleep then  slow to wake for breast biopsy.  Marland Kitchen Dyspnea   . Elevated lipids   . GERD (gastroesophageal reflux disease)   . Headache   . History of hiatal hernia   . Lung nodule   . Mitral valve disorder   . PAF (paroxysmal atrial fibrillation) ([place])    ablation 2013  . Stroke [facility]) 2013   no deficits  . Vertigo    last episode 2-3 yrs ago  . Wears hearing aid in both ears     Past Surgical History:  Procedure Laterality Date  . BREAST BIOPSY Left 04/23/2010   bx /clip-neg  . BREAST BIOPSY Right 2010   neg  . cardiac ablasion    . COLONOSCOPY    . COLONOSCOPY WITH PROPOFOL N/A 01/25/2018   Procedure: COLONOSCOPY WITH PROPOFOL;  Surgeon: Lollie Sails, MD;  Location: [facility] ENDOSCOPY;  Service: Endoscopy;  Laterality: N/A;  . DIRECT LARYNGOSCOPY Left 02/18/2018   Procedure: DIRECT LARYNGOSCOPY WITH EXCISION LARYNGEAL LESION;  Surgeon: Margaretha Sheffield, MD;  Location: [place];  Service: ENT;  Laterality: Left;  . ESOPHAGOGASTRODUODENOSCOPY (EGD) WITH PROPOFOL N/A 01/25/2018   Procedure: ESOPHAGOGASTRODUODENOSCOPY (EGD) WITH PROPOFOL;  Surgeon: Lollie Sails, MD;  Location[facility] ENDOSCOPY;  Service: Endoscopy;  Laterality: N/A;  . HERNIA REPAIR    . pelvic surgery x2 for post-partum hemmoghage N/A   . TONSILLECTOMY    . TUBAL LIGATION      Family History  Problem Relation Age of Onset  . Breast cancer Daughter 10   Social History:  reports that she has never smoked. She has never used smokeless tobacco. She reports that she does not drink  alcohol or use drugs.  Allergies:  Allergies  Allergen Reactions  . Colcrys [Colchicine] Diarrhea  . Propafenone Palpitations    Medications:  I have reviewed the patient's current medications. Scheduled: . cholecalciferol  2,000 Units Oral Daily  . fluticasone  2 spray Each Nare Daily  . metoprolol succinate  50 mg Oral Daily  . multivitamin with minerals  1 tablet Oral Daily  . pantoprazole  40 mg Oral BID  . rivaroxaban  20  mg Oral Q supper  . rosuvastatin  20 mg Oral Daily   Prior to Admission medications   Medication Sig Start Date End Date Taking? Authorizing Provider  Cholecalciferol (D3 HIGH POTENCY) 2000 units CAPS Take 1 capsule by mouth daily.    Yes [provider]  metoprolol succinate (TOPROL-XL) 50 MG 24 hr tablet Take 50 mg by mouth daily. Take with or immediately following a meal.   Yes [provider]  Multiple Vitamin (MULTIVITAMIN) tablet Take 1 tablet by mouth daily.   Yes [provider]  pantoprazole (PROTONIX) 40 MG tablet Take 40 mg by mouth 2 (two) times daily.   Yes [provider]  rivaroxaban (XARELTO) 20 MG TABS tablet Take 20 mg by mouth daily with supper.   Yes [provider]  rosuvastatin (CRESTOR) 20 MG tablet Take 20 mg by mouth daily.   Yes [provider]  benzonatate (TESSALON) 100 MG capsule Take 1 capsule (100 mg total) by mouth 3 (three) times daily as needed. Patient not taking: Reported on 08/09/2018 06/07/18   Coral Spikes, DO  diazepam (VALIUM) 2 MG tablet Take 0.5 tablets (1 mg total) by mouth every 12 (twelve) hours as needed for anxiety. Patient not taking: Reported on 08/09/2018 07/28/18   Coral Spikes, DO  fluticasone (VERAMYST) 27.5 MCG/SPRAY nasal spray Place 2 sprays into the nose daily.    [provider]  meclizine (ANTIVERT) 25 MG tablet Take 1 tablet (25 mg total) by mouth 3 (three) times daily as needed for dizziness. 07/28/18   Coral Spikes, DO  predniSONE (DELTASONE) 50 MG tablet 1 tablet daily x 5 days. Patient not taking: Reported on 08/09/2018 06/07/18   Coral Spikes, DO     ROS: History obtained from the patient   General ROS: negative for - chills, fatigue, fever, night sweats, weight gain or weight loss Psychological ROS: negative for - behavioral disorder, hallucinations, memory difficulties, mood swings or suicidal ideation Ophthalmic ROS: negative for - blurry vision, double vision,  eye pain or loss of vision ENT ROS: negative for - epistaxis, nasal discharge, oral lesions, sore throat. Positive for  tinnitus and vertigo Allergy and Immunology ROS: negative for - hives or itchy/watery eyes Hematological and Lymphatic ROS: negative for - bleeding problems, bruising or swollen lymph nodes Endocrine ROS: negative for - galactorrhea, hair pattern changes, polydipsia/polyuria or temperature intolerance Respiratory ROS: negative for - cough, hemoptysis, shortness of breath or wheezing Cardiovascular ROS: negative for - chest pain, dyspnea on exertion, edema or irregular heartbeat Gastrointestinal ROS: negative for - abdominal pain, diarrhea, hematemesis, Positive for  nausea/vomiting Genito-Urinary ROS: negative for - dysuria, hematuria, incontinence or urinary frequency/urgency Musculoskeletal ROS: negative for - joint swelling or muscular weakness Neurological ROS: as noted in HPI Dermatological ROS: negative for rash and skin lesion changes  Physical Exam   Vitals Blood pressure 124/62, pulse 64, temperature 97.8 F (36.6 C), temperature source Oral, resp. rate 18, height 5\' 6"  (1.676 m), weight 70.8 kg, SpO2 100 %.  General Exam . Patient looks appropriate of age, well built, nourished and appropriately groomed.  . Cardiovascular Exam: S1, S2 heart sounds present  . Carotid exam revealed no bruit  . Lung exam was clear to auscultation  .  Neurological Exam . Alert,  . Oriented to time, place, person  . Attention span and concentration seemed appropriate  . Language seemed intact (naming, spontaneous speech, comprehension)  . I. Olfactory not examined . II: Visual fields were full. Pupils were equal, round and reactive to light and accommodation . III,IV, VI: ptosis not present, extra-ocular motions intact bilaterally . V,VII: smile symmetric, facial light touch sensation normal bilaterally . VIII: Finger rub was heard symmetric in both ears . IX, X: Palate and  uvular movements are normal and oral sensations are OK, gag reflex deffered . XI: Neck muscle strength and shoulder shrug is normal . XII: midline tongue extension . Tone is normal in all extremities, no abnormal movements seen  . Muscle strength in all extremities seemed normal.  . Deep tendon reflexes were symmetric  . Sensations were intact to light touch in all extremities  . Gait and station are normal when examined in small exam room  Data Reviewed  Laboratory Studies:  Basic Metabolic Panel: Recent Labs  Lab 08/09/18 1411  NA 141  K 4.1  CL 105  CO2 27  GLUCOSE 108*  BUN 13  CREATININE 0.90  CALCIUM 9.8    Liver Function Tests: Recent Labs  Lab 08/09/18 1411  AST 30  ALT 16  ALKPHOS 61  BILITOT 0.7  PROT 7.7  ALBUMIN 5.0   No results for input(s): LIPASE, AMYLASE in the last 168 hours. No results for input(s): AMMONIA in the last 168 hours.  CBC: Recent Labs  Lab 08/09/18 1411  WBC 10.3  NEUTROABS 8.0*  HGB 13.9  HCT 41.0  MCV 89.2  PLT 336    Cardiac Enzymes: Recent Labs  Lab 08/09/18 1411  TROPONINI <0.03    BNP: Invalid input(s): POCBNP  CBG: No results for input(s): GLUCAP in the last 168 hours.  Microbiology: No results found for this or any previous visit.  Coagulation Studies: No results for input(s): LABPROT, INR in the last 72 hours.  Urinalysis:  Recent Labs  Lab 08/09/18 1413  COLORURINE STRAW*  LABSPEC 1.004*  PHURINE 7.0  GLUCOSEU NEGATIVE  HGBUR NEGATIVE  BILIRUBINUR NEGATIVE  KETONESUR NEGATIVE  PROTEINUR NEGATIVE  NITRITE NEGATIVE  LEUKOCYTESUR NEGATIVE    Lipid Panel:    Component Value Date/Time   CHOL 150 08/10/2018 0801   TRIG 132 08/10/2018 0801   HDL 39 (L) 08/10/2018 0801   CHOLHDL 3.8 08/10/2018 0801   VLDL 26 08/10/2018 0801   LDLCALC 85 08/10/2018 0801    HgbA1C: No results found for: HGBA1C  Urine Drug Screen:  No results found for: LABOPIA, COCAINSCRNUR, LABBENZ, AMPHETMU, THCU,  LABBARB  Alcohol Level: No results for input(s): ETH in the last 168 hours.  Other results: EKG: sinus rhythm at 70 bpm.  Imaging: Ct Head Wo Contrast  Result Date: 08/09/2018 CLINICAL DATA:  82 year old female with sudden onset weakness today, vertigo for the past three weeks. EXAM: CT HEAD WITHOUT CONTRAST TECHNIQUE: Contiguous axial images were obtained from the base of the skull through the vertex without intravenous contrast. COMPARISON:  Head CT without contrast 07/28/2018, 09/13/2006. FINDINGS: Brain: Cerebral volume is normal for age. No midline shift, ventriculomegaly, mass effect, evidence of mass lesion, intracranial hemorrhage or evidence of cortically based  acute infarction. Minimal to mild for age scattered white matter hypodensity with otherwise normal gray-white matter differentiation throughout the brain. No cortical encephalomalacia identified. Vascular: Calcified atherosclerosis at the skull base. No suspicious intracranial vascular hyperdensity. Skull: Negative. Sinuses/Orbits: Visualized paranasal sinuses and mastoids are stable and well pneumatized. Other: No acute orbit or scalp soft tissue finding. IMPRESSION: Stable and normal for age noncontrast Head CT. Electronically Signed   By: Genevie Ann M.D.   On: 08/09/2018 14:40   Mr Brain Wo Contrast  Result Date: 08/10/2018 CLINICAL DATA:  82 y/o  F; severe dizziness and weakness. EXAM: MRI HEAD WITHOUT CONTRAST MRA HEAD WITHOUT CONTRAST TECHNIQUE: Multiplanar, multiecho pulse sequences of the brain and surrounding structures were obtained without intravenous contrast. Angiographic images of the head were obtained using MRA technique without contrast. COMPARISON:  08/09/2018 CT head. FINDINGS: MRI HEAD FINDINGS Brain: No acute infarction, hemorrhage, hydrocephalus, extra-axial collection or mass lesion. Few nonspecific T2 FLAIR hyperintensities in subcortical and periventricular white matter are compatible with mild chronic microvascular  ischemic changes for age. Mild nonfocal volume loss of the brain. Vascular: As below. Skull and upper cervical spine: Normal marrow signal. Sinuses/Orbits: Negative. Other: None. MRA HEAD FINDINGS Anterior circulation: No large vessel occlusion, aneurysm, or significant stenosis is identified. Posterior circulation: No large vessel occlusion, aneurysm, or significant stenosis is identified. Anatomic variant: None significant. IMPRESSION: 1. No acute intracranial abnormality identified. 2. Mild chronic microvascular ischemic changes and volume loss of the brain. 3. Normal MRA of the head. Electronically Signed   By: Kristine Garbe M.D.   On: 08/10/2018 01:11   Mr Jodene Nam Head/brain WU Cm  Result Date: 08/10/2018 CLINICAL DATA:  82 y/o  F; severe dizziness and weakness. EXAM: MRI HEAD WITHOUT CONTRAST MRA HEAD WITHOUT CONTRAST TECHNIQUE: Multiplanar, multiecho pulse sequences of the brain and surrounding structures were obtained without intravenous contrast. Angiographic images of the head were obtained using MRA technique without contrast. COMPARISON:  08/09/2018 CT head. FINDINGS: MRI HEAD FINDINGS Brain: No acute infarction, hemorrhage, hydrocephalus, extra-axial collection or mass lesion. Few nonspecific T2 FLAIR hyperintensities in subcortical and periventricular white matter are compatible with mild chronic microvascular ischemic changes for age. Mild nonfocal volume loss of the brain. Vascular: As below. Skull and upper cervical spine: Normal marrow signal. Sinuses/Orbits: Negative. Other: None. MRA HEAD FINDINGS Anterior circulation: No large vessel occlusion, aneurysm, or significant stenosis is identified. Posterior circulation: No large vessel occlusion, aneurysm, or significant stenosis is identified. Anatomic variant: None significant. IMPRESSION: 1. No acute intracranial abnormality identified. 2. Mild chronic microvascular ischemic changes and volume loss of the brain. 3. Normal MRA of the  head. Electronically Signed   By: Kristine Garbe M.D.   On: 08/10/2018 01:11    Patient seen and examined.  Clinical course and management discussed.  Necessary edits performed.  I agree with the above.  Assessment and plan of care developed and discussed below.     Assessment: 82 y.o. female presenting with episode of dizziness and imbalance concerning for stroke. Patient with a history of vertigo although she felt this presentation was somewhat different.  MRI of the brain reviewed and shows no acute changes.  Likelihood of acute ischemic cerebrovascular event is low.  Patient on Xarelto.    Stroke Risk Factors - atrial fibrillation, hyperlipidemia and hypertension  Plan: 1. Continue Xarelto 2. Patient to see ENT on an outpatient basis for further evaluation 3. No further neurologic intervention is recommended at this time.  If further questions arise, please  call or page at that time.  Thank you for allowing neurology to participate in the care of this patient.  Patient to continue follow up with neurology on an outpatient basis.     This patient was staffed with Dr. Magda Paganini, Doy Mince who personally evaluated patient, reviewed documentation and agreed with assessment and plan of care as above.  Rufina Falco, DNP, FNP-BC Board certified Nurse Practitioner Neurology Department  08/10/2018, 12:45 PM    Alexis Goodell, MD Neurology [phone]  08/10/2018  3:14 PM

## 2018-08-10 NOTE — Discharge Instructions (Signed)
Resume diet and activity as before ° ° °

## 2018-08-10 NOTE — Evaluation (Signed)
Clinical/Bedside Swallow Evaluation Patient Details  Name: Traci Holland MRN: [id number] Date of Birth: [birth year]  Today's Date: 08/10/2018 Time: SLP Start Time (ACUTE ONLY): 0845 SLP Stop Time (ACUTE ONLY): 0930 SLP Time Calculation (min) (ACUTE ONLY): 45 min  Past Medical History:  Past Medical History:  Diagnosis Date  . Asthma   . Complication of anesthesia    hard to get to sleep then slow to wake for breast biopsy.  Marland Kitchen Dyspnea   . Elevated lipids   . GERD (gastroesophageal reflux disease)   . Headache   . History of hiatal hernia   . Lung nodule   . Mitral valve disorder   . PAF (paroxysmal atrial fibrillation) ([place])    ablation 2013  . Stroke [facility]) 2013   no deficits  . Vertigo    last episode 2-3 yrs ago  . Wears hearing aid in both ears    Past Surgical History:  Past Surgical History:  Procedure Laterality Date  . BREAST BIOPSY Left 04/23/2010   bx /clip-neg  . BREAST BIOPSY Right 2010   neg  . cardiac ablasion    . COLONOSCOPY    . COLONOSCOPY WITH PROPOFOL N/A 01/25/2018   Procedure: COLONOSCOPY WITH PROPOFOL;  Surgeon: Lollie Sails, MD;  Location: [facility] ENDOSCOPY;  Service: Endoscopy;  Laterality: N/A;  . DIRECT LARYNGOSCOPY Left 02/18/2018   Procedure: DIRECT LARYNGOSCOPY WITH EXCISION LARYNGEAL LESION;  Surgeon: Margaretha Sheffield, MD;  Location: [place];  Service: ENT;  Laterality: Left;  . ESOPHAGOGASTRODUODENOSCOPY (EGD) WITH PROPOFOL N/A 01/25/2018   Procedure: ESOPHAGOGASTRODUODENOSCOPY (EGD) WITH PROPOFOL;  Surgeon: Lollie Sails, MD;  Location: [facility] ENDOSCOPY;  Service: Endoscopy;  Laterality: N/A;  . HERNIA REPAIR    . pelvic surgery x2 for post-partum hemmoghage N/A   . TONSILLECTOMY    . TUBAL LIGATION     HPI:  Pt is a 82 y.o. female with a known history of Asthma, chronic vertigo, dyspnea, hiatal hernia, hyperlipidemia, GERD, paroxysmal atrial fibrillation, previous stroke who is presenting to the ER with severe dizziness.   Patient earlier today was trying to walk when she started a swaying towards one side and then became very weak and her legs gave way.  She states that this did not seem like her vertigo.  She has been feeling dizzy recently.  Re: her swallowing, pt had a MBSS in 06/2018 which revealed "Flash laryngeal penetration with ingestion of thin liquid/nectar thick liquid otherwise negative exam" per Radiologist.  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 During the MBSS, she presented with Mild oropharyngeal dysphagia characterized by delayed initiation of posterior transfer, delayed pharyngeal swallow initiation, and transient laryngeal penetration.  With the exception of delayed initiation of posterior transfer, oral control of the bolus is within normal limits.   Delayed initiation of posterior transfer is consistent with both neuromuscular changes and fear of swallowing.  Delayed pharyngeal swallow initiation is consistent with effects of laryngopharyngeal reflux (inflammation, edema, and resultant decreased sensation of the larynx and pharynx) and neuromuscular changes.  Pt was referred to f/u w/ GI for management and potential f/u w/ Neurologist if concerns persist.  Pt did endorse s/s of REFLUX at home.    Assessment / Plan / Recommendation Clinical Impression  Pt appears to present w/ Mild pharyngeal phase dysphagia as presented on a recent MBSS in 06/2018  in which similar pharyngeal phase dysphagia was dx'd: "delayed pharyngeal swallow initiation resulting in transient laryngeal penetration. With the exception of delayed initiation of posterior transfer, oral control of the bolus is within normal limits. Delayed initiation of posterior transfer is consistent with both neuromuscular changes and fear of swallowing. Delayed pharyngeal  swallow initiation is consistent with effects of laryngopharyngeal reflux (inflammation, edema, and resultant decreased sensation of the larynx and pharynx) and neuromuscular changes". Pt was referred to f/u w/ GI for management. Pt did endorse s/s of REFLUX  at this evaluation at home and belching was noted during meal w/ SLP. Pt consumed trials of thin liquids via Cup w/ instruction/education on using Small, Single Sips Slowly for increased bolus control during intake and swallowing. Mild, delayed throat clearing was present x1 during intake; encouraged pt to mindful of this occurring and use a strong cough to aid clearing if needed. No decline in respiratory effort/presentation was noted during po trials. No overt s/s of aspiration were noted w/ trials of purees/soft solids. Oral phase was grossly [facility] for bolus management and oral clearing; timely A-P transfer. Pt fed self. She described her swallowing issues and endorsed s/s of Reflux at home and "many years ago". Education given on need to monitor for s/s Reflux as pt c/o difficulty eating "biscuits" and "meats".  Encouraged her to follow the aspiration precautions recommended especially when drinking thin liquids - encouraged pt to not use Straws, especially the large one in room water jug. Discussed best positioning for any oral intake was sitting in a chair. Recommended use of a PUREE for SWALLOWING PILLS at home (applesauce, yogurt) and more Cream Soups in her diet vs broth soups d/t laryngeal penetration risk. Recommended a more mech soft type diet w/ aspiration precautions; Pills in puree; REFLUX precautions and f/u w/ GI. No further skilled ST Services indicated at this time. NSG to reconsult if needed while admitted.  SLP Visit Diagnosis: Dysphagia, pharyngeal phase (R13.13)    Aspiration Risk  Mild aspiration risk(reduced somewhat when following aspiration precautions)    Diet Recommendation  Mech Soft meats/foods w/ thin liquids; aspiration  precautions including NO Straws. Reflux precautions.   Medication Administration: Whole meds with puree(for safer swallowing)    Other  Recommendations Recommended Consults: Consider GI evaluation(continued f/u) Oral Care Recommendations: Oral care BID;Patient independent with oral care Other Recommendations: (n/a)   Follow up Recommendations None      Frequency and Duration (n/a)  (n/a)       Prognosis Prognosis for Safe Diet Advancement: Fair(-Good)      Swallow Study   General Date of Onset: 08/09/18 HPI: Pt is a 82 y.o. female with a known history of Asthma, chronic vertigo, dyspnea, hiatal hernia, hyperlipidemia, GERD, paroxysmal atrial fibrillation, previous stroke who is presenting to the ER with severe dizziness.  Patient earlier today was trying to walk when she started a swaying towards one side and then became very weak and her legs gave way.  She states that this did not seem like her vertigo.  She has been feeling dizzy recently.  Re: her swallowing, pt had a MBSS in 06/2018 which revealed "Flash laryngeal penetration with ingestion of thin liquid/nectar thick liquid otherwise negative exam" per Radiologist.  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 During the MBSS, she presented with Mild oropharyngeal dysphagia characterized  by delayed initiation of posterior transfer, delayed pharyngeal swallow initiation, and transient laryngeal penetration.  With the exception of delayed initiation of posterior transfer, oral control of the bolus is within normal limits.   Delayed initiation of posterior transfer is consistent with both neuromuscular changes and fear of swallowing.  Delayed pharyngeal swallow initiation is consistent with effects of laryngopharyngeal reflux (inflammation, edema, and resultant  decreased sensation of the larynx and pharynx) and neuromuscular changes.  Pt was referred to f/u w/ GI for management and potential f/u w/ Neurologist if concerns persist.  Pt did endorse s/s of REFLUX at home.  Type of Study: Bedside Swallow Evaluation(d/t pt's ongoing questions re: oral intake, diet, precaution) Previous Swallow Assessment: MBSS in 06/2018 Diet Prior to this Study: Regular;Thin liquids(per pt at home also) Temperature Spikes Noted: No(wbc 10.3) Respiratory Status: Room air History of Recent Intubation: No Behavior/Cognition: Alert;Cooperative;Pleasant mood(HOH - wears hearing aids) Oral Cavity Assessment: Within Functional Limits Oral Care Completed by SLP: Recent completion by staff Oral Cavity - Dentition: Adequate natural dentition Vision: Functional for self-feeding Self-Feeding Abilities: Able to feed self Patient Positioning: Upright in bed(needed positioning cues for full upright position) Baseline Vocal Quality: Normal Volitional Cough: Strong Volitional Swallow: Able to elicit    Oral/Motor/Sensory Function Overall Oral Motor/Sensory Function: Within functional limits   Ice Chips Ice chips: Not tested   Thin Liquid Thin Liquid: Impaired Presentation: Cup;Self Fed(8-10 trials during meal) Oral Phase Impairments: (none) Oral Phase Functional Implications: (none) Pharyngeal  Phase Impairments: Throat Clearing - Delayed(x1 during intake) Other Comments: pt does have baseline laryngeal penetration as per MBSS in 06/2018    Nectar Thick Nectar Thick Liquid: Not tested   Honey Thick Honey Thick Liquid: Not tested   Puree Puree: Within functional limits Presentation: Self Fed;Spoon(4 trials of oatmeal)   Solid     Solid: Within functional limits Presentation: Self Fed;Spoon(6 trials of pancakes) Other Comments: instruction/education given on needing to use small, single bites clearing mouth b/t bites; moistening foods well        Orinda Kenner, MS,  CCC-SLP Cainen Burnham 08/10/2018,3:46 PM

## 2018-08-10 NOTE — Progress Notes (Signed)
SLP Cancellation Note  Patient Details Name: Traci Holland MRN: [id number] DOB: 11-11-1936   Cancelled treatment:       Reason Eval/Treat Not Completed: SLP screened, no needs identified, will sign off(chart reviewed, consulted NSG then met w/ pt). Pt conversed at conversational level w/out deficits noted; pt denied any speech-language deficits - mouth seemed dry and sticky just waking up. NSG reported no issues during interactions and communications w/ pt.  Re: swallowing, pt had a MBSS in 06/2018 which revealed "Flash laryngeal penetration with ingestion of thin liquid/nectar thick liquid otherwise negative exam". Pt denied any overt s/s of aspiration prior to and since admission - overt coughing or choking w/ eating applesauce, drinking water as long as she "slowed down". Discussed the laryngeal penetration and ways to reduce risk for aspiration to occur to include drinking slowly, less straw use especially while drinking in bed, and swallowing Pills in Applesauce (whole). Discussed w/ NSG as well.  ST services will be available for further education if needed while admitted. Pt agreed to follow aspiration precautions. Pt appears she may be at her baseline. NSG to reconsult if any needs while admitted. MD updated.     Traci Kenner, MS, CCC-SLP Traci Holland 08/10/2018, 9:06 AM

## 2018-08-10 NOTE — Progress Notes (Signed)
Discharge instructions along with home medications and follow up gone over with patient and daughter. Both verbalize that they understood instructions. No prescriptions given to patient. IV and tele removed. Pt being discharged home on room air, no distress noted. Jasia Hiltunen S Fenton, RN 

## 2018-08-10 NOTE — Progress Notes (Signed)
OT Cancellation Note  Patient Details Name: Traci Holland MRN: [id number] DOB: 23-Oct-1936   Cancelled Treatment:    Reason Eval/Treat Not Completed: OT screened, no needs identified, will sign off. Pt. Also preparing to go to ultrasound.   Harrel Carina, MS, OTR/L 08/10/2018, 9:32 AM

## 2018-08-16 ENCOUNTER — Ambulatory Visit: Payer: Medicare Other | Attending: Neurology

## 2018-08-16 VITALS — BP 116/58 | HR 61

## 2018-08-16 DIAGNOSIS — R42 Dizziness and giddiness: Secondary | ICD-10-CM | POA: Diagnosis not present

## 2018-08-16 DIAGNOSIS — R2681 Unsteadiness on feet: Secondary | ICD-10-CM

## 2018-08-16 NOTE — Therapy (Signed)
[place] [facility] [address] [place], [place], [address] Phone: [phone]   Fax:  [phone]  Physical Therapy Evaluation  Patient Details  Name: Traci Holland MRN: [id number] Date of Birth: 10-12-1936 Referring Provider: Dr. Melrose Nakayama   Encounter Date: 08/16/2018  PT End of Session - 08/16/18 1139    Visit Number  1    Number of Visits  9    Date for PT Re-Evaluation  10/11/18    Authorization Type  Progress note 1/10    Authorization Time Period  Last goals: 08/16/18    PT Start Time  0845    PT Stop Time  1000    PT Time Calculation (min)  75 min    Equipment Utilized During Treatment  Gait belt    Activity Tolerance  Patient tolerated treatment well    Behavior During Therapy  [facility] for tasks assessed/performed       Past Medical History:  Diagnosis Date  . Asthma   . Complication of anesthesia    hard to get to sleep then slow to wake for breast biopsy.  Marland Kitchen Dyspnea   . Elevated lipids   . GERD (gastroesophageal reflux disease)   . Headache   . History of hiatal hernia   . Lung nodule   . Mitral valve disorder   . PAF (paroxysmal atrial fibrillation) ([place])    ablation 2013  . Stroke [facility]) 2013   no deficits  . Vertigo    last episode 2-3 yrs ago  . Wears hearing aid in both ears     Past Surgical History:  Procedure Laterality Date  . BREAST BIOPSY Left 04/23/2010   bx /clip-neg  . BREAST BIOPSY Right 2010   neg  . cardiac ablasion    . COLONOSCOPY    . COLONOSCOPY WITH PROPOFOL N/A 01/25/2018   Procedure: COLONOSCOPY WITH PROPOFOL;  Surgeon: Lollie Sails, MD;  Location: [facility] ENDOSCOPY;  Service: Endoscopy;  Laterality: N/A;  . DIRECT LARYNGOSCOPY Left 02/18/2018   Procedure: DIRECT LARYNGOSCOPY WITH EXCISION LARYNGEAL LESION;  Surgeon: Margaretha Sheffield, MD;  Location: [facility];  Service: ENT;  Laterality: Left;  . ESOPHAGOGASTRODUODENOSCOPY (EGD) WITH PROPOFOL N/A 01/25/2018   Procedure:  ESOPHAGOGASTRODUODENOSCOPY (EGD) WITH PROPOFOL;  Surgeon: Lollie Sails, MD;  Location: [facility] ENDOSCOPY;  Service: Endoscopy;  Laterality: N/A;  . HERNIA REPAIR    . pelvic surgery x2 for post-partum hemmoghage N/A   . TONSILLECTOMY    . TUBAL LIGATION      Vitals:   08/16/18 0856  BP: (!) 116/58  Pulse: 61  SpO2: 98%     Subjective Assessment - 08/16/18 0846    Subjective  Dizziness    Patient is accompained by:  Family member   Daughter   Pertinent History  Traci Holland  is a 82 y.o. female with a known history of Asthma, chronic vertigo, hyperlipidemia, GERD, paroxysmal atrial fibrillation, previous stroke who is presented to the ER on 08/09/18 with severe dizziness as well as mild slurred speech and L sided facial droop. Symptoms resolved. During admission she underwent head CT as well as brain MRI/MRA without significant findings. She saw the neurologist during admission and she reports that they were "leaning toward migraines" as the cause for her symptoms. She was advised to follow-up with neurology but she reports that she would not like to go back to see Dr. Melrose Nakayama but is thinking about following-up with Dr. Manuella Ghazi. She reports significant improvement in symptoms  since discharge from the hospital. She has been using Tylenol and Ibuprofen for headaches. She denies any migraines since discharge from the hospital. Pt reports that prior to the last 2-3 weeks she was getting dizziness very infrequently. However she has been having symptoms more frequently for the last 2-3 weeks. Pt reports history of intermittent vertigo for multiple years. She denies any lightheadedness or syncope during these episodes. She denies numbness/tingling or focal weakness but does complain of general UE/LE weakness. Pt reports history of bilateral tinnitus for multiple years. She wears bilateral hearing aids and sees the audiologist at [facility] but denies every being worked up for her vertigo there. She is  unable to tell if her hearing has been worsening during these episodes. Pt reports some mild intermittent L ear pain but denies any drainage. She reports some mild clouding/blurring of her vision when these episodes occur. She does have a history of cataracts and she has been advised to have surgery. She has had slurred speech and possible facial drooping with these episodes in the past as well. Pt reports a history of migraines for multiple years. She had not had any migraines for 2-3 months and then started to have them daily approximately 3 weeks ago. When her episode of dizziness started prior to admission she did not have a headache initially but states that after her symptoms resolved she started with a headache. She reports sensitivity to light with her migraines but she is not bothered by noise. In addition she has been under added stress as her husband is now in a skilled nursing facility with dementia. It was suggested to involve palliative care and she has been told he only has a few weeks/months left.    Currently in Pain?  No/denies         [facility] PT Assessment - 08/16/18 0857      Assessment   Medical Diagnosis  Dizziness    Referring Provider  Dr. Melrose Nakayama    Onset Date/Surgical Date  08/16/12   Approximate. Pt has had vertigo "for years"   Hand Dominance  Right    Next MD Visit  Encouraged to follow-up with neurology but not yet scheduled    Prior Therapy  Balance after CVA 2013 related to LLE weakness      Precautions   Precautions  None      Restrictions   Weight Bearing Restrictions  No      Balance Screen   Has the patient fallen in the past 6 months  No    Has the patient had a decrease in activity level because of a fear of falling?   Yes    Is the patient reluctant to leave their home because of a fear of falling?   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, blogging  Cognition   Overall Cognitive Status  Within Functional Limits for tasks assessed      Observation/Other Assessments   Other Surveys   Other Surveys    Activities of Balance Confidence Scale (ABC Scale)   91.88%    Dizziness Handicap Inventory (DHI)   40/100      Standardized Balance Assessment   Standardized Balance Assessment  Timed Up and Go Test;Berg Balance Test;Dynamic Gait Index;Five Times Sit to Stand;10 meter walk test    Five times sit to stand comments   14.0s    10 Meter Walk  Self-selected:8.2 s = 1.29m/s; Fastest: 7.2s =1.39 m/s      Berg Balance Test   Sit to Stand  Able to stand without using hands and stabilize independently    Standing Unsupported  Able to stand safely 2 minutes    Sitting with Back Unsupported but Feet Supported on Floor or Stool  Able to sit safely and securely 2 minutes    Stand to Sit  Sits safely with minimal use of hands    Transfers  Able to transfer safely, minor use of hands    Standing Unsupported with Eyes Closed  Able to stand 10 seconds safely    Standing Ubsupported with Feet Together  Able to place feet together independently and stand 1 minute safely    From Standing, Reach Forward with Outstretched Arm  Can reach confidently >25 cm (10")    From Standing Position, Pick up Object from Floor  Able to pick up shoe safely and easily    From Standing Position, Turn to Look Behind Over each Shoulder  Looks behind from both sides and weight shifts well    Turn 360 Degrees  Able to turn 360 degrees safely in 4 seconds or less    Standing Unsupported,  Alternately Place Feet on Step/Stool  Able to stand independently and safely and complete 8 steps in 20 seconds    Standing Unsupported, One Foot in Front  Able to place foot tandem independently and hold 30 seconds    Standing on One Leg  Able to lift leg independently and hold > 10 seconds    Total Score  56      Timed Up and Go Test   TUG  Normal TUG    Normal TUG (seconds)  9.5      Functional Gait  Assessment   Gait assessed   Yes    Gait Level Surface  Walks 20 ft in less than 5.5 sec, no assistive devices, good speed, no evidence for imbalance, normal gait pattern, deviates no more than 6 in outside of the 12 in walkway width.    Change in Gait Speed  Able to smoothly change walking speed without loss of balance or gait deviation. Deviate no more than 6 in outside of the 12 in walkway width.    Gait with Horizontal Head Turns  Performs head turns smoothly with slight change in gait velocity (eg, minor disruption to smooth gait path), deviates 6-10 in outside 12 in walkway width, or uses an assistive device.    Gait with Vertical Head Turns  Performs head turns with no change in gait. Deviates no more than 6 in outside 12 in walkway width.    Gait and Pivot Turn  Pivot turns safely within 3 sec and stops quickly with no loss of balance.    Step Over Obstacle  Is able to step over 2 stacked shoe boxes taped together (9 in total  height) without changing gait speed. No evidence of imbalance.    Gait with Narrow Base of Support  Ambulates 4-7 steps.    Gait with Eyes Closed  Walks 20 ft, slow speed, abnormal gait pattern, evidence for imbalance, deviates 10-15 in outside 12 in walkway width. Requires more than 9 sec to ambulate 20 ft.    Ambulating Backwards  Walks 20 ft, uses assistive device, slower speed, mild gait deviations, deviates 6-10 in outside 12 in walkway width.    Steps  Alternating feet, must use rail.    Total Score  23         VESTIBULAR AND BALANCE  EVALUATION   HISTORY:  Subjective history of current problem: Marleah Beever is a 82 y.o. female with a known history of Asthma, chronic vertigo, hyperlipidemia, GERD, paroxysmal atrial fibrillation, previous stroke who is presented to the ER on 08/09/18 with severe dizziness as well as mild slurred speech and L sided facial droop. During admission she underwent head CT as well as brain MRI/MRA without significant findings. She saw the neurologist during admission and she reports that they were "leaning toward migraines" as the cause for her symptoms. She was advised to follow-up with neurology but she reports that she would not like to go back to see Dr. Melrose Nakayama but is thinking about following-up with Dr. Manuella Ghazi. She reports significant improvement in symptoms since discharge from the hospital. She has been using Tylenol and Ibuprofen for headaches. She denies any migraines since discharge from the hospital. Pt reports that prior to the last 2-3 weeks she was getting dizziness very infrequently. However she has been having symptoms more frequently for the last 2-3 weeks. Pt reports history of intermittent vertigo for multiple years. She denies any lightheadedness or syncope during these episodes. She denies numbness/tingling or focal weakness but does complain of general UE/LE weakness. Pt reports history of bilateral tinnitus for multiple years. She wears bilateral hearing aids and sees the audiologist at [facility] but denies every being worked up for her vertigo there. She is unable to tell if her hearing has been worsening during these episodes. Pt reports some mild intermittent L ear pain but denies any drainage. She reports some mild clouding/blurring of her vision when these episodes occur. She does have a history of cataracts and she has been advised to have surgery. She has had slurred speech and possible facial drooping with these episodes in the past as well. Pt reports a history of migraines for multiple  years. She had not had any migraines for 2-3 months and then started to have them daily approximately 3 weeks ago. When her episode of dizziness started prior to admission she did not have a headache initially but states that after her symptoms resolved she started with a headache. She reports sensitivity to light with her migraines but she is not bothered by noise. In addition she has been under added stress as her husband is now in a skilled nursing facility with dementia. It was suggested to involve palliative care and she has been told he only has a few weeks/months left.  Description of dizziness: (vertigo, unsteadiness, lightheadedness, falling, general unsteadiness, whoozy, swimmy-headed sensation, aural fullness) Pt has two separate symptoms. She has dizziness with "crooked walking." Pt has had episodes of true vertigo but these are typically months, sometimes years, apart. She has a prescription for Meclizine. Pt reports that these do not appear related. Frequency: Every day with either dizziness or vertigo over the last few weeks  with "head pressure" Duration: Hours Symptom nature: (motion provoked, positional, spontaneous, constant, variable, intermittent) Possible head movements  Provocative Factors: Unknown, Easing Factors: Resting, meclizine  Progression of symptoms: (better, worse, no change since onset) Better over the last week.  History of similar episodes: Yes, chronic vertigo for multiple years  Falls (yes/no): No Number of falls in past 6 months: No  Prior Functional Level: Fully independent, drives  Auditory complaints (tinnitus, pain, drainage): minimal L ear pain, not currently. Chronic bilateral tinnitus Vision (last eye exam, diplopia, recent changes): blurring of vision, history of cataracts  Red Flags: (dysarthria, dysphagia, drop attacks, bowel and bladder changes, recent weight loss/gain) Dysarthria with episodes of vertigo/dizziness, intermittent dysphagia  secondary to silent reflux. Pt reports flash penetration on swallow study but no aspiration.     EXAMINATION  POSTURE: No gross deficits identified  NEUROLOGICAL SCREEN: (2+ unless otherwise noted.) N=normal  Ab=abnormal  Level Dermatome R L Myotome R L Reflex R L  C3 Anterior Neck   Sidebend C2-3 N N Jaw CN V    C4 Top of Shoulder   Shoulder Shrug C4 N N Hoffman's UMN    C5 Lateral Upper Arm   Shoulder ABD C4-5 N N Biceps C5-6    C6 Lateral Arm/ Thumb   Arm Flex/ Wrist Ext C5-6 N N Brachiorad. C5-6    C7 Middle Finger   Arm Ext//Wrist Flex C6-7 N N Triceps C7    C8 4th & 5th Finger   Flex/ Ext Carpi Ulnaris C8 N N Patellar (L3-4)    T1 Medial Arm   Interossei T1 N N Gastrocnemius    L2 Medial thigh/groin   Illiopsoas (L2-3) N N     L3 Lower thigh/med.knee   Quadriceps (L3-4) N N     L4 Medial leg/lat thigh   Tibialis Ant (L4-5) N N     L5 Lat. leg & dorsal foot   EHL (L5) N N     S1 post/lat foot/thigh/leg   Gastrocnemius (S1-2) N N     S2 Post./med. thigh & leg   Hamstrings (L4-S3) N N       SOMATOSENSORY:         Sensation           Intact      Diminished         Absent  Light touch WNL       MUSCULOSKELETAL SCREEN: Cervical Spine ROM: WFL and painless in all planes. No gross deficits identified   ROM: Grossly WNL  MMT: WFL, no deficits identified  Functional Mobility: Independent with all transfers and ambulation without assistive device  Gait: Scanning of visual environment with gait is: Normal, no gross deficits   POSTURAL CONTROL TESTS:   Clinical Test of Sensory Interaction for Balance    (CTSIB):  CONDITION TIME STRATEGY SWAY  Eyes open, firm surface 30 seconds ankle 1+  Eyes closed, firm surface 9.4 seconds ankle 4+  Eyes open, foam surface 30 seconds ankle 3+  Eyes closed, foam surface 4.3 seconds ankle 4+    OCULOMOTOR / VESTIBULAR TESTING:  Oculomotor Exam- Room Light  Findings Comments  Ocular Alignment Normal   Ocular ROM Normal    Spontaneous Nystagmus normal   End-Gaze Nystagmus normal   Smooth Pursuit abnormal A few downbeats in the upper L and and upper R quadrants,  Otherwise relatively smooth.   Saccades normal   VOR abnormal 4/10 dizziness reported  VOR Cancellation abnormal Pt starts to dry heave and  feels very nauseated   Left Head Thrust abnormal Difficult to assess due to startle however appears mildly positive to the left  Right Head Thrust normal   Head Shaking Nystagmus not examined   Static Acuity not examined   Dynamic Acuity not examined     Oculomotor Exam- Fixation Suppressed  Findings Comments  Ocular Alignment normal   Ocular ROM normal   Spontaneous Nystagmus normal   End-Gaze Nystagmus normal   Head Shaking Nystagmus normal     BPPV TESTS:  Symptoms Duration Intensity Nystagmus  L Dix-Hallpike None   None  R Dix-Hallpike None   None  L Head Roll None   None  R Head Roll None   None  L Sidelying Test      R Sidelying Test        FUNCTIONAL OUTCOME MEASURES:  Results Comments  DHI 40/100  Moderate perception of handicap; in need of intervention  ABC Scale 91.88% WNL  FGA 24/30 Falls risk; in need of intervention  BERG 56/56 WNL  TUG 9.5s WNL  5TSTS 14.0s WNL  10 meter Walking Speed Self-selected:8.2 s = 1.84m/s; Fastest: 7.2s =1.39 m/s WNL                             Objective measurements completed on examination: See above findings.    TREATMENT  Neuromuscular Re-education  VOR x 1 horizontal with patient x 30s, pt reports increase in dizziness to 4/10. Pt unable to perform for 60 seconds. Written handout provided with extensive directions about how to perform.           PT Education - 08/16/18 1134    Education Details  outcome measures, goals, HEP    Person(s) Educated  Patient;Child(ren)    Methods  Explanation;Demonstration;Verbal cues;Handout    Comprehension  Verbalized understanding;Returned demonstration       PT Short  Term Goals - 08/16/18 1150      PT SHORT TERM GOAL #1   Title  Pt will be independent with HEP in order to improve strength and balance in order to decrease fall risk and improve function at home and work.     Time  4    Period  Weeks    Status  New    Target Date  09/13/18        PT Long Term Goals - 08/16/18 1150      PT LONG TERM GOAL #1   Title  Pt will improve FGA by at least 6 points in order to demonstrate clinically significant improvement in balance.     Baseline  08/16/18: 24/30    Time  8    Period  Weeks    Status  New    Target Date  10/11/18      PT LONG TERM GOAL #2   Title  Pt will decrease DHI score by at least 18 points in order to demonstrate clinically significant reduction in disability     Baseline  08/16/18: 40/100    Time  8    Period  Weeks    Status  New    Target Date  10/11/18      PT LONG TERM GOAL #3   Title  Pt will report at least 50% improvement in symptoms with vestibular therapy in order to demonstrate significant reduction in symptoms    Time  8    Period  Weeks    Status  New  Target Date  10/11/18             Plan - 08/16/18 1148    Clinical Impression Statement  Pt is a pleasant 82 y.o. female with a known history of asthma, chronic vertigo, hyperlipidemia, GERD, paroxysmal atrial fibrillation, previous stroke who is presented to the ER on 08/09/18 with severe dizziness as well as mild slurred speech and L sided facial droop. During admission she underwent head CT as well as brain MRI/MRA without significant findings. She saw the neurologist during admission and she reports that they were "leaning toward migraines" as the cause for her symptoms according to patient. She reports significant improvement in symptoms since discharge from the hospital. PT evaluation reveals normal ABC, TUG, 5TSTS,  BERG, and 29m gait speed. FGA reveals higher level balance deficits especially with eyes closed gait and retro ambulation. Pt with abnormal  mCTSIB with both firm and foam when closing eyes. Oculomotor exam reveals combination of central and peripheral signs. Canal testing is negative for BPPV. Pt will benefit from PT services to address above noted deficits in order to return to full function at home and decrease symptoms of dizziness.      History and Personal Factors relevant to plan of care:  3 or more personal factors/comorbidities, 4 or more body systems/activity limitations/participation restrictions     Clinical Presentation  Unstable    Clinical Decision Making  High    Rehab Potential  Good    PT Frequency  1x / week    PT Duration  8 weeks    PT Treatment/Interventions  ADLs/Self Care Home Management;Aquatic Therapy;Biofeedback;Canalith Repostioning;Electrical Stimulation;Iontophoresis 4mg /ml Dexamethasone;Moist Heat;Ultrasound;DME Instruction;Gait training;Stair training;Functional mobility training;Therapeutic activities;Therapeutic exercise;Balance training;Neuromuscular re-education;Patient/family education;Manual techniques;Dry needling;Vestibular    PT Next Visit Plan  Consider DVA testing, review and progress VOR as pt is able    PT Home Exercise Plan  VOR x 1 horizontal 30s x 3, QID    Consulted and Agree with Plan of Care  Patient       Patient will benefit from skilled therapeutic intervention in order to improve the following deficits and impairments:  Dizziness, Decreased balance  Visit Diagnosis: Dizziness and giddiness - Plan: PT plan of care cert/re-cert  Unsteadiness on feet - Plan: PT plan of care cert/re-cert     Problem List Patient Active Problem List   Diagnosis Date Noted  . Ataxia 08/09/2018   Phillips Grout PT, DPT, GCS  Devian Bartolomei 08/16/2018, 11:56 AM  [place] [facility] [address] [place], [place], [address] Phone: [phone]   Fax:  [phone]  Name: Traci Holland MRN: [id number] Date of Birth: [birth year]

## 2018-08-21 NOTE — Discharge Summary (Signed)
[facility] [place] NAME: Traci Holland    MR#:  [id number]  DATE OF BIRTH:  Feb 15, 1936  DATE OF ADMISSION:  08/09/2018 ADMITTING PHYSICIAN: Dustin Flock, MD  DATE OF DISCHARGE: 08/10/2018  3:00 PM  PRIMARY CARE PHYSICIAN: Manor, Laurice Record., MD   ADMISSION DIAGNOSIS:  Ataxia [R27.0] Weakness [R53.1]  DISCHARGE DIAGNOSIS:  Active Problems:   Ataxia   SECONDARY DIAGNOSIS:   Past Medical History:  Diagnosis Date  . Asthma   . Complication of anesthesia    hard to get to sleep then slow to wake for breast biopsy.  Marland Kitchen Dyspnea   . Elevated lipids   . GERD (gastroesophageal reflux disease)   . Headache   . History of hiatal hernia   . Lung nodule   . Mitral valve disorder   . PAF (paroxysmal atrial fibrillation) ([place])    ablation 2013  . Stroke [facility]) 2013   no deficits  . Vertigo    last episode 2-3 yrs ago  . Wears hearing aid in both ears      ADMITTING HISTORY  HISTORY OF PRESENT ILLNESS: Traci Holland  is a 82 y.o. female with a known history of  Asthma, chronic vertigo, hyperlipidemia, GERD, paroxysmal atrial fibrillation, previous stroke who is presenting to the ER with severe dizziness.  Patient earlier today was trying to walk when she started a swaying towards one side and then became very weak and her legs gave way.  She states that this is not like her vertigo.  She has been feeling dizzy recently. [name] denies any asymmetrical weakness or numbness.   HOSPITAL COURSE:   *Vertigo Patient admitted to medical floor with telemetry monitoring.  Due to concern for posterior circulation stroke and MRI of the brain was done which showed nothing acute.  Neurology evaluated the patient and did not feel that patient's symptoms were due to stroke.  Patient symptoms are improving and she is being started on meclizine.  Evaluated by physical therapy.  Set up with home health and patient will be discharged home to follow-up with ENT and  primary care physician.  CONSULTS OBTAINED:  Treatment Team:  Alexis Goodell, MD  DRUG ALLERGIES:   Allergies  Allergen Reactions  . Colcrys [Colchicine] Diarrhea  . Propafenone Palpitations    DISCHARGE MEDICATIONS:   Allergies as of 08/10/2018      Reactions   Colcrys [colchicine] Diarrhea   Propafenone Palpitations      Medication List    TAKE these medications   benzonatate 100 MG capsule Commonly known as:  TESSALON Take 1 capsule (100 mg total) by mouth 3 (three) times daily as needed.   D3 HIGH POTENCY 2000 units Caps Generic drug:  Cholecalciferol Take 1 capsule by mouth daily.   diazepam 2 MG tablet Commonly known as:  VALIUM Take 0.5 tablets (1 mg total) by mouth every 12 (twelve) hours as needed for anxiety.   fluticasone 27.5 MCG/SPRAY nasal spray Commonly known as:  VERAMYST Place 2 sprays into the nose daily.   meclizine 25 MG tablet Commonly known as:  ANTIVERT Take 1 tablet (25 mg total) by mouth 3 (three) times daily as needed for dizziness.   metoprolol succinate 50 MG 24 hr tablet Commonly known as:  TOPROL-XL Take 50 mg by mouth daily. Take with or immediately following a meal.   multivitamin tablet Take 1 tablet by mouth daily.   pantoprazole 40 MG tablet Commonly known as:  PROTONIX Take  40 mg by mouth 2 (two) times daily.   predniSONE 50 MG tablet Commonly known as:  DELTASONE 1 tablet daily x 5 days.   rosuvastatin 20 MG tablet Commonly known as:  CRESTOR Take 20 mg by mouth daily.   XARELTO 20 MG Tabs tablet Generic drug:  rivaroxaban Take 20 mg by mouth daily with supper.       Today   VITAL SIGNS:  Blood pressure 124/62, pulse 64, temperature 97.8 F (36.6 C), temperature source Oral, resp. rate 18, height 5\' 6"  (1.676 m), weight 70.8 kg, SpO2 100 %.  I/O:  No intake or output data in the 24 hours ending 08/21/18 1229  PHYSICAL EXAMINATION:  Physical Exam  GENERAL:  82 y.o.-year-old patient lying in the bed  with no acute distress.  LUNGS: Normal breath sounds bilaterally, no wheezing, rales,rhonchi or crepitation. No use of accessory muscles of respiration.  CARDIOVASCULAR: S1, S2 normal. No murmurs, rubs, or gallops.  ABDOMEN: Soft, non-tender, non-distended. Bowel sounds present. No organomegaly or mass.  NEUROLOGIC: Moves all 4 extremities. PSYCHIATRIC: The patient is alert and oriented x 3.  SKIN: No obvious rash, lesion, or ulcer.   DATA REVIEW:   CBC No results for input(s): WBC, HGB, HCT, PLT in the last 168 hours.  Chemistries  No results for input(s): NA, K, CL, CO2, GLUCOSE, BUN, CREATININE, CALCIUM, MG, AST, ALT, ALKPHOS, BILITOT in the last 168 hours.  Invalid input(s): GFRCGP  Cardiac Enzymes No results for input(s): TROPONINI in the last 168 hours.  Microbiology Results  No results found for this or any previous visit.  RADIOLOGY:  No results found.  Follow up with PCP in 1 week.  Management plans discussed with the patient, family and they are in agreement.  CODE STATUS:  Code Status History    Date Active Date Inactive Code Status Order ID Comments User Context   08/09/2018 1740 08/10/2018 1846 Full Code [id number]  Dustin Flock, MD Inpatient      TOTAL TIME TAKING CARE OF THIS PATIENT ON DAY OF DISCHARGE: more than 30 minutes.   Leia Alf Mamie Diiorio M.D on 08/21/2018 at 12:29 PM  Between 7am to 6pm - Pager - [phone]  After 6pm go to [email or web address] - password [email or web address] [facility]  Office  [phone]  CC: Primary care physician; Encarnacion Chu., MD  Note: This dictation was prepared with Dragon dictation along with smaller phrase technology. Any transcriptional errors that result from this process are unintentional.

## 2018-08-26 ENCOUNTER — Ambulatory Visit: Payer: Medicare Other

## 2018-08-26 VITALS — BP 111/58 | HR 59

## 2018-08-26 DIAGNOSIS — R2681 Unsteadiness on feet: Secondary | ICD-10-CM

## 2018-08-26 DIAGNOSIS — R42 Dizziness and giddiness: Secondary | ICD-10-CM

## 2018-08-26 NOTE — Therapy (Addendum)
[place] MAIN [facility] SERVICES [address] [place], [place], [address] Phone: [phone]   Fax:  [phone]  Physical Therapy Treatment  Patient Details  Name: Traci Holland MRN: [id number] Date of Birth: [birth year] Referring Provider: Dr. Melrose Nakayama   Encounter Date: 08/26/2018  PT End of Session - 08/26/18 0853    Visit Number  2    Number of Visits  9    Date for PT Re-Evaluation  10/11/18    Authorization Type  Progress note 2/10    Authorization Time Period  Last goals: 08/16/18    PT Start Time  0900    PT Stop Time  0945    PT Time Calculation (min)  45 min    Equipment Utilized During Treatment  Gait belt    Activity Tolerance  Patient tolerated treatment well    Behavior During Therapy  [facility] for tasks assessed/performed       Past Medical History:  Diagnosis Date  . Asthma   . Complication of anesthesia    hard to get to sleep then slow to wake for breast biopsy.  Marland Kitchen Dyspnea   . Elevated lipids   . GERD (gastroesophageal reflux disease)   . Headache   . History of hiatal hernia   . Lung nodule   . Mitral valve disorder   . PAF (paroxysmal atrial fibrillation) ([place])    ablation 2013  . Stroke [facility]) 2013   no deficits  . Vertigo    last episode 2-3 yrs ago  . Wears hearing aid in both ears     Past Surgical History:  Procedure Laterality Date  . BREAST BIOPSY Left 04/23/2010   bx /clip-neg  . BREAST BIOPSY Right 2010   neg  . cardiac ablasion    . COLONOSCOPY    . COLONOSCOPY WITH PROPOFOL N/A 01/25/2018   Procedure: COLONOSCOPY WITH PROPOFOL;  Surgeon: Lollie Sails, MD;  Location: [facility] ENDOSCOPY;  Service: Endoscopy;  Laterality: N/A;  . DIRECT LARYNGOSCOPY Left 02/18/2018   Procedure: DIRECT LARYNGOSCOPY WITH EXCISION LARYNGEAL LESION;  Surgeon: Margaretha Sheffield, MD;  Location: [place];  Service: ENT;  Laterality: Left;  . ESOPHAGOGASTRODUODENOSCOPY (EGD) WITH PROPOFOL N/A 01/25/2018   Procedure:  ESOPHAGOGASTRODUODENOSCOPY (EGD) WITH PROPOFOL;  Surgeon: Lollie Sails, MD;  Location: [facility] ENDOSCOPY;  Service: Endoscopy;  Laterality: N/A;  . HERNIA REPAIR    . pelvic surgery x2 for post-partum hemmoghage N/A   . TONSILLECTOMY    . TUBAL LIGATION      Vitals:   08/26/18 0902  BP: (!) 111/58  Pulse: (!) 59  SpO2: 100%    Subjective Assessment - 08/26/18 0853    Subjective  Pt reports that she is doing well on this date. No further episodes of dizziness but pt reports she had the "fringes of an episode." Audiologist called and wanted to perform VNG study. VNG study scheduled for September 24th. She has been performing HEP and reports intermittent dizziness. No specific questions or concerns or concerns at this time.    Patient is accompained by:  Family member   Daughter   Pertinent History  Traci Holland  is a 82 y.o. female with a known history of Asthma, chronic vertigo, hyperlipidemia, GERD, paroxysmal atrial fibrillation, previous stroke who is presented to the ER on 08/09/18 with severe dizziness as well as mild slurred speech and L sided facial droop. Symptoms resolved. During admission she underwent head CT as well as brain MRI/MRA without significant findings.  She saw the neurologist during admission and she reports that they were "leaning toward migraines" as the cause for her symptoms. She was advised to follow-up with neurology but she reports that she would not like to go back to see Dr. Melrose Nakayama but is thinking about following-up with Dr. Manuella Ghazi. She reports significant improvement in symptoms since discharge from the hospital. She has been using Tylenol and Ibuprofen for headaches. She denies any migraines since discharge from the hospital. Pt reports that prior to the last 2-3 weeks she was getting dizziness very infrequently. However she has been having symptoms more frequently for the last 2-3 weeks. Pt reports history of intermittent vertigo for multiple years. She denies any  lightheadedness or syncope during these episodes. She denies numbness/tingling or focal weakness but does complain of general UE/LE weakness. Pt reports history of bilateral tinnitus for multiple years. She wears bilateral hearing aids and sees the audiologist at [facility] but denies every being worked up for her vertigo there. She is unable to tell if her hearing has been worsening during these episodes. Pt reports some mild intermittent L ear pain but denies any drainage. She reports some mild clouding/blurring of her vision when these episodes occur. She does have a history of cataracts and she has been advised to have surgery. She has had slurred speech and possible facial drooping with these episodes in the past as well. Pt reports a history of migraines for multiple years. She had not had any migraines for 2-3 months and then started to have them daily approximately 3 weeks ago. When her episode of dizziness started prior to admission she did not have a headache initially but states that after her symptoms resolved she started with a headache. She reports sensitivity to light with her migraines but she is not bothered by noise. In addition she has been under added stress as her husband is now in a skilled nursing facility with dementia. It was suggested to involve palliative care and she has been told he only has a few weeks/months left.    Currently in Pain?  No/denies                 TREATMENT  Neuromuscular Re-education  DVA testing: Static visual acuity: 20/30 Dynamic visual acuity: 20/50 (2 line loss)  VOR VOR x 1 horizontal in sitting 60s x 2, no reported dizziness during this exercise; VOR x 1 hoirzontal in standing 60s x 2,   Tandem Tandem regressing to semitandem balance alternating forward LE with slow horizontal head turns 30s x 2 on each side; Tandem gait in // bars without UE support x 4 lengths;  Ball Tosses Ambulation in hallway with vertical ball tosses to  self 61' x 2; Ambulation in hallway with horizontal balls tosses to therapist 19' x 2 each direction;  Airex Pad Feet apart eyes closed x 30s; Feet together eyes closed x 30s; Feet apart with cone taps alternating LE x 10 each;  Ther-ex  Quantum leg press 120# x 20, 135# x 20, 150# x 17 (muscle failure obtained);    Pt demonstrates excellent motivation with physical therapy session on this date. Unable to reproduce patient's dizziness today. Progressed VOR x 1 horizontal to standing and added semitandem/tandem balance with head turns to HEP. She is able to achieve muscle failure today on Quantum leg press with 17 repetitions at 150# during the third set. Pt enocuraged to find the weight at her gym where she reaches muscle fatigue between 8-12 repetitions.  Pt encouraged to follow-up as scheduled. Will continue to progress balance, vestibular, and strengthening exercises.                PT Education - 08/26/18 0853    Education Details  exercise form/technique, HEP progression    Person(s) Educated  Patient    Methods  Explanation    Comprehension  Verbalized understanding       PT Short Term Goals - 08/16/18 1150      PT SHORT TERM GOAL #1   Title  Pt will be independent with HEP in order to improve strength and balance in order to decrease fall risk and improve function at home and work.     Time  4    Period  Weeks    Status  New    Target Date  09/13/18        PT Long Term Goals - 08/16/18 1150      PT LONG TERM GOAL #1   Title  Pt will improve FGA by at least 6 points in order to demonstrate clinically significant improvement in balance.     Baseline  08/16/18: 24/30    Time  8    Period  Weeks    Status  New    Target Date  10/11/18      PT LONG TERM GOAL #2   Title  Pt will decrease DHI score by at least 18 points in order to demonstrate clinically significant reduction in disability     Baseline  08/16/18: 40/100    Time  8    Period  Weeks    Status   New    Target Date  10/11/18      PT LONG TERM GOAL #3   Title  Pt will report at least 50% improvement in symptoms with vestibular therapy in order to demonstrate significant reduction in symptoms    Time  8    Period  Weeks    Status  New    Target Date  10/11/18            Plan - 08/26/18 0854    Clinical Impression Statement  Pt demonstrates excellent motivation with physical therapy session on this date. Unable to reproduce patient's dizziness today. DVA testing is negative with 2 line loss. Progressed VOR x 1 horizontal to standing and added semitandem/tandem balance with head turns to HEP. She is able to achieve muscle failure today on Quantum leg press with 17 repetitions at 150# during the third set. Pt enocuraged to find the weight at her gym where she reaches muscle fatigue between 8-12 repetitions. Pt encouraged to follow-up as scheduled. Will continue to progress balance, vestibular, and strengthening exercises.     Rehab Potential  Good    PT Frequency  1x / week    PT Duration  8 weeks    PT Treatment/Interventions  ADLs/Self Care Home Management;Aquatic Therapy;Biofeedback;Canalith Repostioning;Electrical Stimulation;Iontophoresis 4mg /ml Dexamethasone;Moist Heat;Ultrasound;DME Instruction;Gait training;Stair training;Functional mobility training;Therapeutic activities;Therapeutic exercise;Balance training;Neuromuscular re-education;Patient/family education;Manual techniques;Dry needling;Vestibular    PT Next Visit Plan  Review HEP, Progress VOR, progress head turning and eyes closed exericses. Progress leg press    PT Home Exercise Plan  VOR x 1 horizontal 60s x 3 QID in standing, Semitandem/tandem horizontal head turns, independent gym program at [employer] and Agree with Plan of Care  Patient       Patient will benefit from skilled therapeutic intervention in order to improve the following deficits  and impairments:  Dizziness, Decreased balance  Visit  Diagnosis: Dizziness and giddiness  Unsteadiness on feet     Problem List Patient Active Problem List   Diagnosis Date Noted  . Ataxia 08/09/2018   Phillips Grout PT, DPT, GCS  Natascha Edmonds 08/26/2018, 10:08 AM  [place] MAIN [facility] SERVICES [address] [place], [place], [address] Phone: [phone]   Fax:  [phone]  Name: Saliah Crisp MRN: [id number] Date of Birth: 03-26-1936

## 2018-08-26 NOTE — Patient Instructions (Signed)
Access Code: [email or web address]  URL: [email or web address]  Date: 08/26/2018  Prepared by: Roxana Hires   Exercises  Standing Gaze Stabilization with Head Rotation - 3 reps - 60 seconds hold - 4x daily - 7x weekly  Tandem Stance with Head Rotation - 3 reps - 30 seconds hold - 4x daily - 7x weekly

## 2018-09-02 ENCOUNTER — Ambulatory Visit: Payer: Medicare Other

## 2018-09-02 VITALS — BP 129/58 | HR 62

## 2018-09-02 DIAGNOSIS — R42 Dizziness and giddiness: Secondary | ICD-10-CM

## 2018-09-02 DIAGNOSIS — R2681 Unsteadiness on feet: Secondary | ICD-10-CM

## 2018-09-02 NOTE — Therapy (Signed)
[place] MAIN [facility] SERVICES [address] [place], [place], [address] Phone: [phone]   Fax:  [phone]  Physical Therapy Treatment  Patient Details  Name: Traci Holland MRN: [id number] Date of Birth: June 23, 1936 Referring Provider: Dr. Melrose Nakayama   Encounter Date: 09/02/2018    Past Medical History:  Diagnosis Date  . Asthma   . Complication of anesthesia    hard to get to sleep then slow to wake for breast biopsy.  Marland Kitchen Dyspnea   . Elevated lipids   . GERD (gastroesophageal reflux disease)   . Headache   . History of hiatal hernia   . Lung nodule   . Mitral valve disorder   . PAF (paroxysmal atrial fibrillation) ([place])    ablation 2013  . Stroke [facility]) 2013   no deficits  . Vertigo    last episode 2-3 yrs ago  . Wears hearing aid in both ears     Past Surgical History:  Procedure Laterality Date  . BREAST BIOPSY Left 04/23/2010   bx /clip-neg  . BREAST BIOPSY Right 2010   neg  . cardiac ablasion    . COLONOSCOPY    . COLONOSCOPY WITH PROPOFOL N/A 01/25/2018   Procedure: COLONOSCOPY WITH PROPOFOL;  Surgeon: Lollie Sails, MD;  Location[facility]COPY;  Service: Endoscopy;  Laterality: N/A;  . DIRECT LARYNGOSCOPY Left 02/18/2018   Procedure: DIRECT LARYNGOSCOPY WITH EXCISION LARYNGEAL LESION;  Surgeon: Margaretha Sheffield, MD;  Location: [place];  Service: ENT;  Laterality: Left;  . ESOPHAGOGASTRODUODENOSCOPY (EGD) WITH PROPOFOL N/A 01/25/2018   Procedure: ESOPHAGOGASTRODUODENOSCOPY (EGD) WITH PROPOFOL;  Surgeon: Lollie Sails, MD;  Location: [facility] ENDOSCOPY;  Service: Endoscopy;  Laterality: N/A;  . HERNIA REPAIR    . pelvic surgery x2 for post-partum hemmoghage N/A   . TONSILLECTOMY    . TUBAL LIGATION      Vitals:   09/02/18 1104  BP: (!) 129/58  Pulse: 62         TREATMENT  Neuromuscular Re-education  VOR VOR x 1 hoirzontal in standing with feet together 60s x 2, no dizziness; VOR x 1  horizontal in standing with feet together with conflicting background 12W (stopped due to nausea), unable to rate dizziness, performed a second time for 35s before pt needs to stop (added to HEP)  Tandem Tandem balance alternating forward LE with slow horizontal head turns 30s x 2 on each side; Tandem gait in // bars on 2"x4" without UE support x 4 lengths;  Ball Tosses Ambulation in hallway with vertical ball tosses to self 60' x 2; Ambulation in hallway with horizontal balls tosses to therapist x 75' each direction; Retro ambulation in hallway with ball pass over shoulder with therapist varying height from shoulder to overhead;  Airex Pad Feet apart with cone taps alternating LE x 10 each;  Ther-ex  Quantum leg press 150#, x 20, x 18;   Patient's symptoms are improving and the only activity that provokes her symptoms today is VOR x 1 horizontal with conflicting background. This was added to her HEP. Attempted VOR x 2 horizontal however pt is unable to coordinate it. Her balance is also improving with tandem gait in parallel bars. She is also able to increase her repetitions with leg press today. Pt has VNG on 09/06/18. Will continue to progress vestibular, strength, and balance exercises. Pt encouraged to follow-up as scheduled.  PT Short Term Goals - 08/16/18 1150      PT SHORT TERM GOAL #1   Title  Pt will be independent with HEP in order to improve strength and balance in order to decrease fall risk and improve function at home and work.     Time  4    Period  Weeks    Status  New    Target Date  09/13/18        PT Long Term Goals - 08/16/18 1150      PT LONG TERM GOAL #1   Title  Pt will improve FGA by at least 6 points in order to demonstrate clinically significant improvement in balance.     Baseline  08/16/18: 24/30    Time  8    Period  Weeks    Status  New    Target Date  10/11/18      PT LONG TERM GOAL #2   Title  Pt  will decrease DHI score by at least 18 points in order to demonstrate clinically significant reduction in disability     Baseline  08/16/18: 40/100    Time  8    Period  Weeks    Status  New    Target Date  10/11/18      PT LONG TERM GOAL #3   Title  Pt will report at least 50% improvement in symptoms with vestibular therapy in order to demonstrate significant reduction in symptoms    Time  8    Period  Weeks    Status  New    Target Date  10/11/18              Patient will benefit from skilled therapeutic intervention in order to improve the following deficits and impairments:  Dizziness, Decreased balance  Visit Diagnosis: Dizziness and giddiness  Unsteadiness on feet     Problem List Patient Active Problem List   Diagnosis Date Noted  . Ataxia 08/09/2018   Phillips Grout PT, DPT, GCS  Traci Holland 09/03/2018, 9:47 PM  [place] MAIN [facility] SERVICES [address] [place], [place], [address] Phone: [phone]   Fax:  [phone]  Name: Traci Holland MRN: [id number] Date of Birth: 1936-07-29

## 2018-09-09 ENCOUNTER — Encounter: Payer: Medicare Other | Admitting: Physical Therapy

## 2018-09-16 ENCOUNTER — Ambulatory Visit: Payer: Medicare Other | Attending: Neurology | Admitting: Physical Therapy

## 2018-09-16 ENCOUNTER — Encounter: Payer: Self-pay | Admitting: Physical Therapy

## 2018-09-16 DIAGNOSIS — R42 Dizziness and giddiness: Secondary | ICD-10-CM

## 2018-09-16 DIAGNOSIS — R2681 Unsteadiness on feet: Secondary | ICD-10-CM

## 2018-09-16 NOTE — Therapy (Signed)
[place] MAIN [facility] SERVICES [address] [place], [place], [address] Phone: [phone]   Fax:  [phone]  Physical Therapy Treatment  Patient Details  Name: Traci Holland MRN: [id number] Date of Birth: May 08, 1936 Referring Provider (PT): Dr. Melrose Nakayama   Encounter Date: 09/16/2018  PT End of Session - 09/16/18 1109    Visit Number  4    Number of Visits  9    Date for PT Re-Evaluation  10/11/18    Authorization Type  Progress note 4/10    Authorization Time Period  Last goals: 08/16/18    PT Start Time  1108    Equipment Utilized During Treatment  Gait belt    Activity Tolerance  Patient tolerated treatment well    Behavior During Therapy  [employer] for tasks assessed/performed       Past Medical History:  Diagnosis Date  . Asthma   . Complication of anesthesia    hard to get to sleep then slow to wake for breast biopsy.  Marland Kitchen Dyspnea   . Elevated lipids   . GERD (gastroesophageal reflux disease)   . Headache   . History of hiatal hernia   . Lung nodule   . Mitral valve disorder   . PAF (paroxysmal atrial fibrillation) ([place])    ablation 2013  . Stroke [facility]) 2013   no deficits  . Vertigo    last episode 2-3 yrs ago  . Wears hearing aid in both ears     Past Surgical History:  Procedure Laterality Date  . BREAST BIOPSY Left 04/23/2010   bx /clip-neg  . BREAST BIOPSY Right 2010   neg  . cardiac ablasion    . COLONOSCOPY    . COLONOSCOPY WITH PROPOFOL N/A 01/25/2018   Procedure: COLONOSCOPY WITH PROPOFOL;  Surgeon: Lollie Sails, MD;  Location: [facility] ENDOSCOPY;  Service: Endoscopy;  Laterality: N/A;  . DIRECT LARYNGOSCOPY Left 02/18/2018   Procedure: DIRECT LARYNGOSCOPY WITH EXCISION LARYNGEAL LESION;  Surgeon: Margaretha Sheffield, MD;  Location: [place];  Service: ENT;  Laterality: Left;  . ESOPHAGOGASTRODUODENOSCOPY (EGD) WITH PROPOFOL N/A 01/25/2018   Procedure: ESOPHAGOGASTRODUODENOSCOPY (EGD) WITH PROPOFOL;  Surgeon:  Lollie Sails, MD;  Location: [facility] ENDOSCOPY;  Service: Endoscopy;  Laterality: N/A;  . HERNIA REPAIR    . pelvic surgery x2 for post-partum hemmoghage N/A   . TONSILLECTOMY    . TUBAL LIGATION      There were no vitals filed for this visit.  Subjective Assessment - 09/16/18 1108    Subjective  Patient     Patient is accompained by:  Family member   Daughter   Pertinent History  Traci Holland  is a 82 y.o. female with a known history of Asthma, chronic vertigo, hyperlipidemia, GERD, paroxysmal atrial fibrillation, previous stroke who is presented to the ER on 08/09/18 with severe dizziness as well as mild slurred speech and L sided facial droop. Symptoms resolved. During admission she underwent head CT as well as brain MRI/MRA without significant findings. She saw the neurologist during admission and she reports that they were "leaning toward migraines" as the cause for her symptoms. She was advised to follow-up with neurology but she reports that she would not like to go back to see Dr. Melrose Nakayama but is thinking about following-up with Dr. Manuella Ghazi. She reports significant improvement in symptoms since discharge from the hospital. She has been using Tylenol and Ibuprofen for headaches. She denies any migraines since discharge from the hospital. Pt reports that  prior to the last 2-3 weeks she was getting dizziness very infrequently. However she has been having symptoms more frequently for the last 2-3 weeks. Pt reports history of intermittent vertigo for multiple years. She denies any lightheadedness or syncope during these episodes. She denies numbness/tingling or focal weakness but does complain of general UE/LE weakness. Pt reports history of bilateral tinnitus for multiple years. She wears bilateral hearing aids and sees the audiologist at [facility] but denies every being worked up for her vertigo there. She is unable to tell if her hearing has been worsening during these episodes. Pt reports some  mild intermittent L ear pain but denies any drainage. She reports some mild clouding/blurring of her vision when these episodes occur. She does have a history of cataracts and she has been advised to have surgery. She has had slurred speech and possible facial drooping with these episodes in the past as well. Pt reports a history of migraines for multiple years. She had not had any migraines for 2-3 months and then started to have them daily approximately 3 weeks ago. When her episode of dizziness started prior to admission she did not have a headache initially but states that after her symptoms resolved she started with a headache. She reports sensitivity to light with her migraines but she is not bothered by noise. In addition she has been under added stress as her husband is now in a skilled nursing facility with dementia. It was suggested to involve palliative care and she has been told he only has a few weeks/months left.       Patient reports recent blood work showed some renal function problems. She has a follow-up appointment at the end of the month with her physician to discuss.  Patient reports she was tired last weekend and noticed some veering with walking but states she did not have any dizzy spells and did not need to take Meclizine.  Patient states she did not do a lot of her exercises last weekend because she felt too "tipsy" but states she did as much as she could do. Patient states she had a very busy day yesterday and was out in the heat in and out all day and she was pleased that she was able to tolerate it without symptoms.  Patient states that she did have a VNG test on 09/06/18 but states she has not heard the results yet.  Patient states she has been going to [employer] and this last week she was able to walk on the treadmill for 5 minutes for the first time since 08/09/18. Patient states she used to go to the gym 3 times a week prior to 8/27 and that she has been gradually trying  to build back up to being able to do the same exercises/weights and frequency. Patient states she has gone twice this week and hopes to go tomorrow.   Neuromuscular Re-education: Airex balance beam: On Airex balance beam, performed sideways stance static holds with horizontal and vertical head turns.  On Airex balance beam, performed sideways stepping with horizontal head turns 5' times 4 reps. On Airex balance beam, performed sideways stepping with vertical head turns 5' times 4 reps. On Airex balance beam, performed tandem walking 5' times 6 reps.   Diona Foley toss over shoulder: Patient performed multiple 80' trials of forward and retro ambulation while tossing ball over one shoulder with return catch over opposite shoulder with CGA.  Patient denies dizziness with this activity. Patient performed multiple  36' trials of forward and retro ambulation while tossing ball over one shoulder with return catch over opposite shoulder varying the ball position to head, shoulder and waist level to promote head turning and tilting. Patient denies dizziness.     Therapeutic Exercise: Leg Press: Patient performed leg press machine #150 pounds 20 reps, 14 reps and 10 reps.  Patient demonstrates good technique but requires cue to control flexion as she becomes fatigued.            PT Education - 09/16/18 1109    Person(s) Educated  Patient    Methods  Explanation    Comprehension  Verbalized understanding       PT Short Term Goals - 08/16/18 1150      PT SHORT TERM GOAL #1   Title  Pt will be independent with HEP in order to improve strength and balance in order to decrease fall risk and improve function at home and work.     Time  4    Period  Weeks    Status  New    Target Date  09/13/18        PT Long Term Goals - 08/16/18 1150      PT LONG TERM GOAL #1   Title  Pt will improve FGA by at least 6 points in order to demonstrate clinically significant improvement in balance.      Baseline  08/16/18: 24/30    Time  8    Period  Weeks    Status  New    Target Date  10/11/18      PT LONG TERM GOAL #2   Title  Pt will decrease DHI score by at least 18 points in order to demonstrate clinically significant reduction in disability     Baseline  08/16/18: 40/100    Time  8    Period  Weeks    Status  New    Target Date  10/11/18      PT LONG TERM GOAL #3   Title  Pt will report at least 50% improvement in symptoms with vestibular therapy in order to demonstrate significant reduction in symptoms    Time  8    Period  Weeks    Status  New    Target Date  10/11/18            Plan - 09/16/18 1109    Rehab Potential  Good    PT Frequency  1x / week    PT Duration  8 weeks    PT Treatment/Interventions  ADLs/Self Care Home Management;Aquatic Therapy;Biofeedback;Canalith Repostioning;Electrical Stimulation;Iontophoresis 4mg /ml Dexamethasone;Moist Heat;Ultrasound;DME Instruction;Gait training;Stair training;Functional mobility training;Therapeutic activities;Therapeutic exercise;Balance training;Neuromuscular re-education;Patient/family education;Manual techniques;Dry needling;Vestibular    PT Next Visit Plan  Review HEP, Progress VOR to forward/retro ambulation, progress head turning and eyes closed exericses. Progress leg press. Consider discharge in the next couple visits if pt continues to improve with minimal symptoms.     PT Home Exercise Plan  VOR x 1 horizontal 60s x 3 QID in standing, Semitandem/tandem horizontal head turns, independent gym program at [employer] and Agree with Plan of Care  Patient       Patient will benefit from skilled therapeutic intervention in order to improve the following deficits and impairments:  Dizziness, Decreased balance  Visit Diagnosis: Dizziness and giddiness  Unsteadiness on feet     Problem List Patient Active Problem List   Diagnosis Date Noted  . Ataxia 08/09/2018  Lady Deutscher PT,  DPT [phone] Lady Deutscher 09/16/2018, 11:10 AM  [place] MAIN [facility] SERVICES [address] [place], [place], [address] Phone: [phone]   Fax:  [phone]  Name: Traci Holland MRN: [id number] Date of Birth: [birth year]

## 2018-09-23 ENCOUNTER — Ambulatory Visit: Payer: Medicare Other

## 2018-09-23 DIAGNOSIS — R42 Dizziness and giddiness: Secondary | ICD-10-CM | POA: Diagnosis not present

## 2018-09-23 DIAGNOSIS — R2681 Unsteadiness on feet: Secondary | ICD-10-CM

## 2018-09-23 NOTE — Therapy (Signed)
[place] MAIN [facility] SERVICES [address] [place], [place], [address] Phone: [phone]   Fax:  [phone]  Physical Therapy Treatment/Progress Note  Dates of reporting period  08/16/18   to   09/23/18  Patient Details  Name: Traci Holland MRN: [id number] Date of Birth: 22-Jun-1936 Referring Provider (PT): Dr. Melrose Nakayama   Encounter Date: 09/23/2018  PT End of Session - 09/23/18 1100    Visit Number  5    Number of Visits  9    Date for PT Re-Evaluation  10/11/18    Authorization Type  Progress note 5/10    Authorization Time Period  Last goals: 08/16/18    PT Start Time  1055    PT Stop Time  1140    PT Time Calculation (min)  45 min    Equipment Utilized During Treatment  Gait belt    Activity Tolerance  Patient tolerated treatment well    Behavior During Therapy  [facility] for tasks assessed/performed       Past Medical History:  Diagnosis Date  . Asthma   . Complication of anesthesia    hard to get to sleep then slow to wake for breast biopsy.  Marland Kitchen Dyspnea   . Elevated lipids   . GERD (gastroesophageal reflux disease)   . Headache   . History of hiatal hernia   . Lung nodule   . Mitral valve disorder   . PAF (paroxysmal atrial fibrillation) ([place])    ablation 2013  . Stroke [facility]) 2013   no deficits  . Vertigo    last episode 2-3 yrs ago  . Wears hearing aid in both ears     Past Surgical History:  Procedure Laterality Date  . BREAST BIOPSY Left 04/23/2010   bx /clip-neg  . BREAST BIOPSY Right 2010   neg  . cardiac ablasion    . COLONOSCOPY    . COLONOSCOPY WITH PROPOFOL N/A 01/25/2018   Procedure: COLONOSCOPY WITH PROPOFOL;  Surgeon: Lollie Sails, MD;  Location: [facility] ENDOSCOPY;  Service: Endoscopy;  Laterality: N/A;  . DIRECT LARYNGOSCOPY Left 02/18/2018   Procedure: DIRECT LARYNGOSCOPY WITH EXCISION LARYNGEAL LESION;  Surgeon: Margaretha Sheffield, MD;  Location: [place];  Service: ENT;  Laterality: Left;  .  ESOPHAGOGASTRODUODENOSCOPY (EGD) WITH PROPOFOL N/A 01/25/2018   Procedure: ESOPHAGOGASTRODUODENOSCOPY (EGD) WITH PROPOFOL;  Surgeon: Lollie Sails, MD;  Location: [facility] ENDOSCOPY;  Service: Endoscopy;  Laterality: N/A;  . HERNIA REPAIR    . pelvic surgery x2 for post-partum hemmoghage N/A   . TONSILLECTOMY    . TUBAL LIGATION      There were no vitals filed for this visit.  Subjective Assessment - 09/23/18 1059    Subjective  Pt reports that she went to [employer] on Monday and didn't have any issues. The only episode of dizziness she had was when she went to [employer]. She started to have a headache and her dizziness started so she left. She is a little tired today but otherwise no concerns. No pain reported at this time.     Patient is accompained by:  --    Pertinent History  Traci Holland  is a 82 y.o. female with a known history of Asthma, chronic vertigo, hyperlipidemia, GERD, paroxysmal atrial fibrillation, previous stroke who is presented to the ER on 08/09/18 with severe dizziness as well as mild slurred speech and L sided facial droop. Symptoms resolved. During admission she underwent head CT as well as brain  MRI/MRA without significant findings. She saw the neurologist during admission and she reports that they were "leaning toward migraines" as the cause for her symptoms. She was advised to follow-up with neurology but she reports that she would not like to go back to see Dr. Melrose Nakayama but is thinking about following-up with Dr. Manuella Ghazi. She reports significant improvement in symptoms since discharge from the hospital. She has been using Tylenol and Ibuprofen for headaches. She denies any migraines since discharge from the hospital. Pt reports that prior to the last 2-3 weeks she was getting dizziness very infrequently. However she has been having symptoms more frequently for the last 2-3 weeks. Pt reports history of intermittent vertigo for multiple years. She denies any  lightheadedness or syncope during these episodes. She denies numbness/tingling or focal weakness but does complain of general UE/LE weakness. Pt reports history of bilateral tinnitus for multiple years. She wears bilateral hearing aids and sees the audiologist at [facility] but denies every being worked up for her vertigo there. She is unable to tell if her hearing has been worsening during these episodes. Pt reports some mild intermittent L ear pain but denies any drainage. She reports some mild clouding/blurring of her vision when these episodes occur. She does have a history of cataracts and she has been advised to have surgery. She has had slurred speech and possible facial drooping with these episodes in the past as well. Pt reports a history of migraines for multiple years. She had not had any migraines for 2-3 months and then started to have them daily approximately 3 weeks ago. When her episode of dizziness started prior to admission she did not have a headache initially but states that after her symptoms resolved she started with a headache. She reports sensitivity to light with her migraines but she is not bothered by noise. In addition she has been under added stress as her husband is now in a skilled nursing facility with dementia. It was suggested to involve palliative care and she has been told he only has a few weeks/months left.    Currently in Pain?  No/denies             [facility] PT Assessment - 09/23/18 1105      Observation/Other Assessments   Other Surveys   Other Surveys    Dizziness Handicap Inventory [facility])   12/100      Functional Gait  Assessment   Gait assessed   Yes    Gait Level Surface  Walks 20 ft in less than 5.5 sec, no assistive devices, good speed, no evidence for imbalance, normal gait pattern, deviates no more than 6 in outside of the 12 in walkway width.    Change in Gait Speed  Able to smoothly change walking speed without loss of balance or gait deviation.  Deviate no more than 6 in outside of the 12 in walkway width.    Gait with Horizontal Head Turns  Performs head turns smoothly with no change in gait. Deviates no more than 6 in outside 12 in walkway width    Gait with Vertical Head Turns  Performs head turns with no change in gait. Deviates no more than 6 in outside 12 in walkway width.    Gait and Pivot Turn  Pivot turns safely within 3 sec and stops quickly with no loss of balance.    Step Over Obstacle  Is able to step over 2 stacked shoe boxes taped together (9 in total height) without  changing gait speed. No evidence of imbalance.    Gait with Narrow Base of Support  Is able to ambulate for 10 steps heel to toe with no staggering.    Gait with Eyes Closed  Walks 20 ft, slow speed, abnormal gait pattern, evidence for imbalance, deviates 10-15 in outside 12 in walkway width. Requires more than 9 sec to ambulate 20 ft.    Ambulating Backwards  Walks 20 ft, no assistive devices, good speed, no evidence for imbalance, normal gait    Steps  Alternating feet, must use rail.    Total Score  27         TREATMENT  Neuromuscular Re-education: Reviewed HEP with patient Performed FGA and pt scored 27/30 compared to 23/30 at initial evaluation; Performed Sensory Organization Test with patient and she scored  Pt completed [place] 12/100 (unbilled); Reviewed and updated goals with patient; Pt instructed in practicing eyes closed balance activities; Semitandem balance with eyes closed alternating forward LE x 30s each; Eyes closed slow marching x 10 each;  Pt educated throughout session about proper posture and technique with exercises. Improved exercise technique, movement at target joints, use of target muscles after min to mod verbal, visual, tactile cues.      Pt demonstrates excellent motivation with physical therapy session on this date. Pt reports that VNG was negative for peripheral pathology. [place] improved today to 12/100 compared to 40/100 at  initial evaluation. She reports at least 90% improvement in symptoms since starting therapy. Sensory Organization Test today shoes deficits in visual, somatosensory, and vestibular systems but most notably in vestibular system. She falls almost immediately when on compliant surfaces with eyes closed. Pt provided exercises to add HEP involving eyes closed balance to challenge her vestibular system. Pt state that she would like to finish her current episode of care. Will continue to work on eyes closed balance activities in follow-up sessions. Pt encouraged to follow-up as scheduled. Will continue to progress balance, vestibular, and strengthening exercises.                        PT Short Term Goals - 09/23/18 1101      PT SHORT TERM GOAL #1   Title  Pt will be independent with HEP in order to improve strength and balance in order to decrease fall risk and improve function at home and work.     Time  4    Period  Weeks    Status  Achieved        PT Long Term Goals - 09/23/18 1101      PT LONG TERM GOAL #1   Title  Pt will improve FGA by at least 6 points in order to demonstrate clinically significant improvement in balance.     Baseline  08/16/18: 24/30; 09/23/18:     Time  8    Period  Weeks    Status  On-going      PT LONG TERM GOAL #2   Title  Pt will decrease DHI score by at least 18 points in order to demonstrate clinically significant reduction in disability     Baseline  08/16/18: 40/100; 09/23/18:     Time  8    Period  Weeks    Status  On-going      PT LONG TERM GOAL #3   Title  Pt will report at least 50% improvement in symptoms with vestibular therapy in order to demonstrate significant reduction in symptoms  Baseline  09/23/18: 90-100% improvement    Time  8    Period  Weeks    Status  Partially Met    Target Date  10/11/18            Plan - 09/23/18 1100    Clinical Impression Statement  Pt demonstrates excellent motivation with physical  therapy session on this date. Pt reports that VNG was negative for peripheral pathology. [place] improved today to 12/100 compared to 40/100 at initial evaluation. She reports at least 90% improvement in symptoms since starting therapy. Sensory Organization Test today shoes deficits in visual, somatosensory, and vestibular systems but most notably in vestibular system. She falls almost immediately when on compliant surfaces with eyes closed. Pt provided exercises to add HEP involving eyes closed balance to challenge her vestibular system. Pt state that she would like to finish her current episode of care. Will continue to work on eyes closed balance activities in follow-up sessions. Pt encouraged to follow-up as scheduled. Will continue to progress balance, vestibular, and strengthening exercises.      Rehab Potential  Good    PT Frequency  1x / week    PT Duration  8 weeks    PT Treatment/Interventions  ADLs/Self Care Home Management;Aquatic Therapy;Biofeedback;Canalith Repostioning;Electrical Stimulation;Iontophoresis '4mg'$ /ml Dexamethasone;Moist Heat;Ultrasound;DME Instruction;Gait training;Stair training;Functional mobility training;Therapeutic activities;Therapeutic exercise;Balance training;Neuromuscular re-education;Patient/family education;Manual techniques;Dry needling;Vestibular    PT Next Visit Plan  Review HEP, Progress VOR to forward/retro ambulation, progress head turning and eyes closed exericses. Continue with eyes closed balance activities    PT Home Exercise Plan  VOR x 1 horizontal 60s x 3 QID in standing with conflicting background, Semitandem/tandem horizontal head turns, independent gym program at [employer] and Agree with Plan of Care  Patient       Patient will benefit from skilled therapeutic intervention in order to improve the following deficits and impairments:  Dizziness, Decreased balance  Visit Diagnosis: Dizziness and giddiness  Unsteadiness on  feet     Problem List Patient Active Problem List   Diagnosis Date Noted  . Ataxia 08/09/2018   Phillips Grout PT, DPT, GCS  Huprich,Jason 09/23/2018, 8:58 PM  [place] MAIN [facility] SERVICES [address] [place], [place], [address] Phone: [phone]   Fax:  [phone]  Name: Julanne Schlueter MRN: [id number] Date of Birth: [birth year]

## 2018-09-30 ENCOUNTER — Ambulatory Visit: Payer: Medicare Other

## 2018-09-30 DIAGNOSIS — R2681 Unsteadiness on feet: Secondary | ICD-10-CM

## 2018-09-30 DIAGNOSIS — R42 Dizziness and giddiness: Secondary | ICD-10-CM

## 2018-09-30 NOTE — Therapy (Signed)
[place] MAIN [facility] SERVICES [address] [place], [place], [address] Phone: [phone]   Fax:  [phone]  Physical Therapy Treatment  Patient Details  Name: Traci Holland MRN: [id number] Date of Birth: [birth year] Referring Provider (PT): Dr. Melrose Nakayama   Encounter Date: 09/30/2018  PT End of Session - 09/30/18 1045    Visit Number  6    Number of Visits  9    Date for PT Re-Evaluation  10/11/18    Authorization Type  Progress note 6/10    Authorization Time Period  Last goals: 08/16/18    PT Start Time  1046    PT Stop Time  1130    PT Time Calculation (min)  44 min    Equipment Utilized During Treatment  Gait belt    Activity Tolerance  Patient tolerated treatment well    Behavior During Therapy  [facility] for tasks assessed/performed       Past Medical History:  Diagnosis Date  . Asthma   . Complication of anesthesia    hard to get to sleep then slow to wake for breast biopsy.  Marland Kitchen Dyspnea   . Elevated lipids   . GERD (gastroesophageal reflux disease)   . Headache   . History of hiatal hernia   . Lung nodule   . Mitral valve disorder   . PAF (paroxysmal atrial fibrillation) ([place])    ablation 2013  . Stroke [facility]) 2013   no deficits  . Vertigo    last episode 2-3 yrs ago  . Wears hearing aid in both ears     Past Surgical History:  Procedure Laterality Date  . BREAST BIOPSY Left 04/23/2010   bx /clip-neg  . BREAST BIOPSY Right 2010   neg  . cardiac ablasion    . COLONOSCOPY    . COLONOSCOPY WITH PROPOFOL N/A 01/25/2018   Procedure: COLONOSCOPY WITH PROPOFOL;  Surgeon: Lollie Sails, MD;  Location: [facility] ENDOSCOPY;  Service: Endoscopy;  Laterality: N/A;  . DIRECT LARYNGOSCOPY Left 02/18/2018   Procedure: DIRECT LARYNGOSCOPY WITH EXCISION LARYNGEAL LESION;  Surgeon: Margaretha Sheffield, MD;  Location: [place];  Service: ENT;  Laterality: Left;  . ESOPHAGOGASTRODUODENOSCOPY (EGD) WITH PROPOFOL N/A 01/25/2018   Procedure:  ESOPHAGOGASTRODUODENOSCOPY (EGD) WITH PROPOFOL;  Surgeon: Lollie Sails, MD;  Location: [facility] ENDOSCOPY;  Service: Endoscopy;  Laterality: N/A;  . HERNIA REPAIR    . pelvic surgery x2 for post-partum hemmoghage N/A   . TONSILLECTOMY    . TUBAL LIGATION      There were no vitals filed for this visit.  Subjective Assessment - 09/30/18 1045    Subjective  Pt states that she is doing alright today. She moved her husband from [employer] to [facility] and on Monday she fell with her husband while he was transferring from his wheelchair to the bed. She strained her back and has been having some left low back pain radiating down the LLE.     Pertinent History  Traci Holland  is a 82 y.o. female with a known history of Asthma, chronic vertigo, hyperlipidemia, GERD, paroxysmal atrial fibrillation, previous stroke who is presented to the ER on 08/09/18 with severe dizziness as well as mild slurred speech and L sided facial droop. Symptoms resolved. During admission she underwent head CT as well as brain MRI/MRA without significant findings. She saw the neurologist during admission and she reports that they were "leaning toward migraines" as the cause for her symptoms. She was advised to  follow-up with neurology but she reports that she would not like to go back to see Dr. Melrose Nakayama but is thinking about following-up with Dr. Manuella Ghazi. She reports significant improvement in symptoms since discharge from the hospital. She has been using Tylenol and Ibuprofen for headaches. She denies any migraines since discharge from the hospital. Pt reports that prior to the last 2-3 weeks she was getting dizziness very infrequently. However she has been having symptoms more frequently for the last 2-3 weeks. Pt reports history of intermittent vertigo for multiple years. She denies any lightheadedness or syncope during these episodes. She denies numbness/tingling or focal weakness but does complain of general UE/LE weakness. Pt  reports history of bilateral tinnitus for multiple years. She wears bilateral hearing aids and sees the audiologist at [facility] but denies every being worked up for her vertigo there. She is unable to tell if her hearing has been worsening during these episodes. Pt reports some mild intermittent L ear pain but denies any drainage. She reports some mild clouding/blurring of her vision when these episodes occur. She does have a history of cataracts and she has been advised to have surgery. She has had slurred speech and possible facial drooping with these episodes in the past as well. Pt reports a history of migraines for multiple years. She had not had any migraines for 2-3 months and then started to have them daily approximately 3 weeks ago. When her episode of dizziness started prior to admission she did not have a headache initially but states that after her symptoms resolved she started with a headache. She reports sensitivity to light with her migraines but she is not bothered by noise. In addition she has been under added stress as her husband is now in a skilled nursing facility with dementia. It was suggested to involve palliative care and she has been told he only has a few weeks/months left.    Currently in Pain?  Yes    Pain Score  --   Pt doesn't rate when asked   Pain Location  Back           TREATMENT  Neuromuscular Re-education: NuStep L0-1 x 5 minutes for warm-up with MHP on lumbar spine Palpation along patient's spine with pain reported with gentle CPA assessment at L4 and L5; Eyes closed forward and retro ambulation 30' x 2 each; Feet together eyes closed balance x 30s; Feet together eyes closed balance with horizontal head turns x 30s; Airex feet apart eyes closed balance x 30s; Airex feet apart eyes closed balance with horizontal head turns x 30s and vertical head turns x 30s; Airex feet together eyes closed x 30s; Airex feet together eyes closed with horizontal and  vertical head turns x 30s, very challenging for patient; Airex balance with stepping stone taps as called out by therapist x multiple bouts in 1 and 2 stone sequencing; Eyes closed slow marching x 10 bilateral; On Airex balance beam, performed sideways stepping with horizontal head turns 5' x 4 reps. On Airex balance beam, performed sideways stepping with vertical head turns 5' x 4 reps. On Airex balance beam, performed tandem forward and backwards walking 5' x 6 reps.     Pt educated throughout session about proper posture and technique with exercises. Improved exercise technique, movement at target joints, use of target muscles after min to mod verbal, visual, tactile cues.      Pt demonstrates excellent motivation with physical therapy session on this date. Strengthening exercises due to  low back pain s/p fall. Pt is nontender with palpation to parapsinal muscles but tender with palpation to spinouse processes of L4-L5. She has a history of chronic back pain. She denies any saddle paresthesia or change in bowel/bladder control since fall. Pain is improving and pt encouraged to see her PCP if symptoms don't continue to improve or if they worsen. Pt continues to demonstrate difficulty with balance during eyes closed activities specially on Airex pad. Will continue to work on eyes closed balance activities in follow-up sessions. Next visit is last appointment so will repeat outcome measures and then plan to discharge. Pt encouraged to follow-up as scheduled. Will continue to progress balance, vestibular, and strengthening exercises.                       PT Short Term Goals - 09/23/18 1101      PT SHORT TERM GOAL #1   Title  Pt will be independent with HEP in order to improve strength and balance in order to decrease fall risk and improve function at home and work.     Time  4    Period  Weeks    Status  Achieved        PT Long Term Goals - 09/23/18 1101      PT LONG  TERM GOAL #1   Title  Pt will improve FGA by at least 6 points in order to demonstrate clinically significant improvement in balance.     Baseline  08/16/18: 24/30; 09/23/18:     Time  8    Period  Weeks    Status  On-going      PT LONG TERM GOAL #2   Title  Pt will decrease DHI score by at least 18 points in order to demonstrate clinically significant reduction in disability     Baseline  08/16/18: 40/100; 09/23/18:     Time  8    Period  Weeks    Status  On-going      PT LONG TERM GOAL #3   Title  Pt will report at least 50% improvement in symptoms with vestibular therapy in order to demonstrate significant reduction in symptoms    Baseline  09/23/18: 90-100% improvement    Time  8    Period  Weeks    Status  Partially Met    Target Date  10/11/18            Plan - 09/30/18 1046    Clinical Impression Statement  Pt demonstrates excellent motivation with physical therapy session on this date. Strengthening exercises due to low back pain s/p fall. Pt is nontender with palpation to parapsinal muscles but tender with palpation to spinouse processes of L4-L5. She has a history of chronic back pain. She denies any saddle paresthesia or change in bowel/bladder control since fall. Pain is improving and pt encouraged to see her PCP if symptoms don't continue to improve or if they worsen. Pt continues to demonstrate difficulty with balance during eyes closed activities specially on Airex pad. Will continue to work on eyes closed balance activities in follow-up sessions. Next visit is last appointment so will repeat outcome measures and then plan to discharge. Pt encouraged to follow-up as scheduled. Will continue to progress balance, vestibular, and strengthening exercises.     Rehab Potential  Good    PT Frequency  1x / week    PT Duration  8 weeks    PT Treatment/Interventions  ADLs/Self Care  Home Management;Aquatic Therapy;Biofeedback;Canalith Repostioning;Electrical  Stimulation;Iontophoresis 90m/ml Dexamethasone;Moist Heat;Ultrasound;DME Instruction;Gait training;Stair training;Functional mobility training;Therapeutic activities;Therapeutic exercise;Balance training;Neuromuscular re-education;Patient/family education;Manual techniques;Dry needling;Vestibular    PT Next Visit Plan  Outcome measures, update goals, discharge.     PT Home Exercise Plan  VOR x 1 horizontal 60s x 3 QID in standing with conflicting background, Semitandem/tandem horizontal head turns, independent gym program at [employer] Agree with Plan of Care  Patient       Patient will benefit from skilled therapeutic intervention in order to improve the following deficits and impairments:  Dizziness, Decreased balance  Visit Diagnosis: Dizziness and giddiness  Unsteadiness on feet     Problem List Patient Active Problem List   Diagnosis Date Noted  . Ataxia 08/09/2018   JPhillips GroutPT, DPT, GCS  Huprich,Jason 09/30/2018, 12:26 PM  [place] [facility] [address] [address]Phone: [phone]  Fax:  [phone] Name: VVashon ArchMRN: [id number] of Birth: [birth year]

## 2018-10-07 ENCOUNTER — Ambulatory Visit: Payer: Medicare Other

## 2018-10-07 DIAGNOSIS — R42 Dizziness and giddiness: Secondary | ICD-10-CM | POA: Diagnosis not present

## 2018-10-07 DIAGNOSIS — R2681 Unsteadiness on feet: Secondary | ICD-10-CM

## 2018-10-07 NOTE — Therapy (Signed)
[place] MAIN [facility] SERVICES [address] [place], [place], [address] Phone: [phone]   Fax:  [phone]  Physical Therapy Treatment/Recertification  Patient Details  Name: Traci Holland MRN: [id number] Date of Birth: 1935/12/16 Referring Provider (PT): Dr. Melrose Nakayama   Encounter Date: 10/07/2018    Past Medical History:  Diagnosis Date  . Asthma   . Complication of anesthesia    hard to get to sleep then slow to wake for breast biopsy.  Marland Kitchen Dyspnea   . Elevated lipids   . GERD (gastroesophageal reflux disease)   . Headache   . History of hiatal hernia   . Lung nodule   . Mitral valve disorder   . PAF (paroxysmal atrial fibrillation) ([place])    ablation 2013  . Stroke [facility]) 2013   no deficits  . Vertigo    last episode 2-3 yrs ago  . Wears hearing aid in both ears     Past Surgical History:  Procedure Laterality Date  . BREAST BIOPSY Left 04/23/2010   bx /clip-neg  . BREAST BIOPSY Right 2010   neg  . cardiac ablasion    . COLONOSCOPY    . COLONOSCOPY WITH PROPOFOL N/A 01/25/2018   Procedure: COLONOSCOPY WITH PROPOFOL;  Surgeon: Lollie Sails, MD;  Location: [facility] ENDOSCOPY;  Service: Endoscopy;  Laterality: N/A;  . DIRECT LARYNGOSCOPY Left 02/18/2018   Procedure: DIRECT LARYNGOSCOPY WITH EXCISION LARYNGEAL LESION;  Surgeon: Margaretha Sheffield, MD;  Location: [place];  Service: ENT;  Laterality: Left;  . ESOPHAGOGASTRODUODENOSCOPY (EGD) WITH PROPOFOL N/A 01/25/2018   Procedure: ESOPHAGOGASTRODUODENOSCOPY (EGD) WITH PROPOFOL;  Surgeon: Lollie Sails, MD;  Location: [facility] ENDOSCOPY;  Service: Endoscopy;  Laterality: N/A;  . HERNIA REPAIR    . pelvic surgery x2 for post-partum hemmoghage N/A   . TONSILLECTOMY    . TUBAL LIGATION      There were no vitals filed for this visit.  Subjective Assessment - 10/08/18 2127    Subjective  Pt reports that she did have one short bout of dizziness when laying down Wednesday  night. She took a Meclizine and then went to bed. She had a headache Thursday and into that night. She didn't feel totally "right" in the head. Today she still feels a little "off."     Pertinent History  [name]  is a 82 y.o. female with a known history of Asthma, chronic vertigo, hyperlipidemia, GERD, paroxysmal atrial fibrillation, previous stroke who is presented to the ER on 08/09/18 with severe dizziness as well as mild slurred speech and L sided facial droop. Symptoms resolved. During admission she underwent head CT as well as brain MRI/MRA without significant findings. She saw the neurologist during admission and she reports that they were "leaning toward migraines" as the cause for her symptoms. She was advised to follow-up with neurology but she reports that she would not like to go back to see Dr. Melrose Nakayama but is thinking about following-up with Dr. Manuella Ghazi. She reports significant improvement in symptoms since discharge from the hospital. She has been using Tylenol and Ibuprofen for headaches. She denies any migraines since discharge from the hospital. Pt reports that prior to the last 2-3 weeks she was getting dizziness very infrequently. However she has been having symptoms more frequently for the last 2-3 weeks. Pt reports history of intermittent vertigo for multiple years. She denies any lightheadedness or syncope during these episodes. She denies numbness/tingling or focal weakness but does complain of general UE/LE weakness. Pt  reports history of bilateral tinnitus for multiple years. She wears bilateral hearing aids and sees the audiologist at [facility] but denies every being worked up for her vertigo there. She is unable to tell if her hearing has been worsening during these episodes. Pt reports some mild intermittent L ear pain but denies any drainage. She reports some mild clouding/blurring of her vision when these episodes occur. She does have a history of cataracts and she has been advised  to have surgery. She has had slurred speech and possible facial drooping with these episodes in the past as well. Pt reports a history of migraines for multiple years. She had not had any migraines for 2-3 months and then started to have them daily approximately 3 weeks ago. When her episode of dizziness started prior to admission she did not have a headache initially but states that after her symptoms resolved she started with a headache. She reports sensitivity to light with her migraines but she is not bothered by noise. In addition she has been under added stress as her husband is now in a skilled nursing facility with dementia. It was suggested to involve palliative care and she has been told he only has a few weeks/months left.    Currently in Pain?  No/denies         TREATMENT  CRT Performed Dix-Hallpike with patient which is negative on the right side. On the left side pt reports feeling a little "off" but denies any true vertigo. She appears to have a very slow beating L torsional nystagmus but it is very faint and difficult to assess. Also difficult to see any vertical component. It does appear to fatigue. Pt taken through CRT for L posterior canal and during third position with nose toward floor pt starts to dry heaving and having severe nausea. She is able to complete CRT but continues to dry heave, almost vomiting. Symptoms eventually resolve. Pt tested in L Dix-Hallpike position again which is negative for symptoms or nystagmus. Pt taken through CRT for one additional bout;  Discussed goals and plan of care with patient.     Pt has made significant improvement in her symptoms since starting therapy. She reports approximately 90% improvement at this time. [place] has decreased from 40/100 to 12/100 and FGA improved from 24/30 to 27/30. She continues to struggle with her balance when her eyes are closed. She did have one bout of oscillopsia when rolling in bed recently. Performed Dix-Hallpike  with patient which is negative on the right side. On the left side pt reports feeling a little "off" but denies any true vertigo. She appears to have a very slow beating L torsional nystagmus but it is very faint and difficult to assess. Also difficult to see any vertical component. It does appear to fatigue. Pt taken through CRT for L posterior canal and during third position with nose toward floor pt starts to dry heaving and having severe nausea. She is able to complete CRT but continues to dry heave, almost vomiting. Symptoms eventually resolve. Pt tested in L Dix-Hallpike position again which is negative for symptoms or nystagmus. Pt taken through CRT for one additional bout. Will retest and treat as needed in follow-up session. Pt is likely close to discharge. Pt will benefit from PT services to address deficits in dizziness and balance in order to return to full function at home.  PT Short Term Goals - 10/07/18 1051      PT SHORT TERM GOAL #1   Title  Pt will be independent with HEP in order to improve strength and balance in order to decrease fall risk and improve function at home and work.     Time  4    Period  Weeks    Status  Achieved        PT Long Term Goals - 10/07/18 1051      PT LONG TERM GOAL #1   Title  Pt will improve FGA by at least 6 points in order to demonstrate clinically significant improvement in balance.     Baseline  08/16/18: 24/30; 09/23/18: 27/30    Time  8    Period  Weeks    Status  On-going    Target Date  11/18/18      PT LONG TERM GOAL #2   Title  Pt will decrease DHI score by at least 18 points in order to demonstrate clinically significant reduction in disability     Baseline  08/16/18: 40/100; 09/23/18: 12/100    Time  8    Period  Weeks    Status  Achieved      PT LONG TERM GOAL #3   Title  Pt will report at least 50% improvement in symptoms with vestibular therapy in order to demonstrate significant  reduction in symptoms    Baseline  09/23/18: 90-100% improvement    Time  8    Period  Weeks    Status  Achieved            Plan - 10/08/18 2131    Clinical Impression Statement  Pt has made significant improvement in her symptoms since starting therapy. She reports approximately 90% improvement at this time. [place] has decreased from 40/100 to 12/100 and FGA improved from 24/30 to 27/30. She continues to struggle with her balance when her eyes are closed. She did have one bout of oscillopsia when rolling in bed recently. Performed Dix-Hallpike with patient which is negative on the right side. On the left side pt reports feeling a little "off" but denies any true vertigo. She appears to have a very slow beating L torsional nystagmus but it is very faint and difficult to assess. Also difficult to see any vertical component. It does appear to fatigue. Pt taken through CRT for L posterior canal and during third position with nose toward floor pt starts to dry heaving and having severe nausea. She is able to complete CRT but continues to dry heave, almost vomiting. Symptoms eventually resolve. Pt tested in L Dix-Hallpike position again which is negative for symptoms or nystagmus. Pt taken through CRT for one additional bout. Will retest and treat as needed in follow-up session. Pt is likely close to discharge. Pt will benefit from PT services to address deficits in dizziness and balance in order to return to full function at home    Rehab Potential  Good    PT Frequency  1x / week    PT Duration  6 weeks    PT Treatment/Interventions  ADLs/Self Care Home Management;Aquatic Therapy;Biofeedback;Canalith Repostioning;Electrical Stimulation;Iontophoresis 4mg /ml Dexamethasone;Moist Heat;Ultrasound;DME Instruction;Gait training;Stair training;Functional mobility training;Therapeutic activities;Therapeutic exercise;Balance training;Neuromuscular re-education;Patient/family education;Manual techniques;Dry  needling;Vestibular    PT Next Visit Plan  Repeat Dix-Hallpike test and [place] as needed, review HEP and once BPPV ruled out discuss discharge    PT Home Exercise Plan  VOR x 1 horizontal 60s x 3 QID in  standing with conflicting background, Semitandem/tandem horizontal head turns, independent gym program at [employer] and Agree with Plan of Care  Patient       Patient will benefit from skilled therapeutic intervention in order to improve the following deficits and impairments:  Dizziness, Decreased balance  Visit Diagnosis: Dizziness and giddiness  Unsteadiness on feet     Problem List Patient Active Problem List   Diagnosis Date Noted  . Ataxia 08/09/2018   Phillips Grout PT, DPT, GCS  Abbagail Scaff 10/08/2018, 9:51 PM  [place] MAIN [facility] SERVICES [address] [place], [place], [address] Phone: [phone]   Fax:  [phone]  Name: Amarianna Abplanalp MRN: [id number] Date of Birth: 08/27/1936

## 2018-10-14 ENCOUNTER — Ambulatory Visit: Payer: Medicare Other | Attending: Neurology

## 2018-10-14 DIAGNOSIS — R42 Dizziness and giddiness: Secondary | ICD-10-CM

## 2018-10-14 DIAGNOSIS — R2681 Unsteadiness on feet: Secondary | ICD-10-CM

## 2018-10-14 NOTE — Therapy (Signed)
[place] MAIN [facility] SERVICES [address] [place], [place], [address] Phone: [phone]   Fax:  [phone]  Physical Therapy Treatment/Discharge  Patient Details  Name: Traci Holland MRN: [id number] Date of Birth: December 30, 1935 Referring Provider (PT): Dr. Melrose Nakayama   Encounter Date: 10/14/2018  PT End of Session - 10/16/18 0804    Visit Number  8    Number of Visits  15    Date for PT Re-Evaluation  11/18/18    Authorization Type  Progress note 3/10    Authorization Time Period  Last goals: 09/23/18    PT Start Time  1100    PT Stop Time  1200    PT Time Calculation (min)  60 min    Equipment Utilized During Treatment  Gait belt    Activity Tolerance  Patient tolerated treatment well    Behavior During Therapy  [facility] for tasks assessed/performed       Past Medical History:  Diagnosis Date  . Asthma   . Complication of anesthesia    hard to get to sleep then slow to wake for breast biopsy.  Marland Kitchen Dyspnea   . Elevated lipids   . GERD (gastroesophageal reflux disease)   . Headache   . History of hiatal hernia   . Lung nodule   . Mitral valve disorder   . PAF (paroxysmal atrial fibrillation) ([place])    ablation 2013  . Stroke [facility]) 2013   no deficits  . Vertigo    last episode 2-3 yrs ago  . Wears hearing aid in both ears     Past Surgical History:  Procedure Laterality Date  . BREAST BIOPSY Left 04/23/2010   bx /clip-neg  . BREAST BIOPSY Right 2010   neg  . cardiac ablasion    . COLONOSCOPY    . COLONOSCOPY WITH PROPOFOL N/A 01/25/2018   Procedure: COLONOSCOPY WITH PROPOFOL;  Surgeon: Lollie Sails, MD;  Location: [facility] ENDOSCOPY;  Service: Endoscopy;  Laterality: N/A;  . DIRECT LARYNGOSCOPY Left 02/18/2018   Procedure: DIRECT LARYNGOSCOPY WITH EXCISION LARYNGEAL LESION;  Surgeon: Margaretha Sheffield, MD;  Location: [place];  Service: ENT;  Laterality: Left;  . ESOPHAGOGASTRODUODENOSCOPY (EGD) WITH PROPOFOL N/A 01/25/2018    Procedure: ESOPHAGOGASTRODUODENOSCOPY (EGD) WITH PROPOFOL;  Surgeon: Lollie Sails, MD;  Location: [facility] ENDOSCOPY;  Service: Endoscopy;  Laterality: N/A;  . HERNIA REPAIR    . pelvic surgery x2 for post-partum hemmoghage N/A   . TONSILLECTOMY    . TUBAL LIGATION      There were no vitals filed for this visit.  Subjective Assessment - 10/16/18 0802    Subjective  Pt reports that she had another severe episode of dizziness and vomiting the other day when she was tht the grocery store. She made it out to the car but had to have her daughter in law drive her home.     Pertinent History  Traci Holland  is a 82 y.o. female with a known history of Asthma, chronic vertigo, hyperlipidemia, GERD, paroxysmal atrial fibrillation, previous stroke who is presented to the ER on 08/09/18 with severe dizziness as well as mild slurred speech and L sided facial droop. Symptoms resolved. During admission she underwent head CT as well as brain MRI/MRA without significant findings. She saw the neurologist during admission and she reports that they were "leaning toward migraines" as the cause for her symptoms. She was advised to follow-up with neurology but she reports that she would not like to go  back to see Dr. Melrose Nakayama but is thinking about following-up with Dr. Manuella Ghazi. She reports significant improvement in symptoms since discharge from the hospital. She has been using Tylenol and Ibuprofen for headaches. She denies any migraines since discharge from the hospital. Pt reports that prior to the last 2-3 weeks she was getting dizziness very infrequently. However she has been having symptoms more frequently for the last 2-3 weeks. Pt reports history of intermittent vertigo for multiple years. She denies any lightheadedness or syncope during these episodes. She denies numbness/tingling or focal weakness but does complain of general UE/LE weakness. Pt reports history of bilateral tinnitus for multiple years. She wears bilateral  hearing aids and sees the audiologist at [facility] but denies every being worked up for her vertigo there. She is unable to tell if her hearing has been worsening during these episodes. Pt reports some mild intermittent L ear pain but denies any drainage. She reports some mild clouding/blurring of her vision when these episodes occur. She does have a history of cataracts and she has been advised to have surgery. She has had slurred speech and possible facial drooping with these episodes in the past as well. Pt reports a history of migraines for multiple years. She had not had any migraines for 2-3 months and then started to have them daily approximately 3 weeks ago. When her episode of dizziness started prior to admission she did not have a headache initially but states that after her symptoms resolved she started with a headache. She reports sensitivity to light with her migraines but she is not bothered by noise. In addition she has been under added stress as her husband is now in a skilled nursing facility with dementia. It was suggested to involve palliative care and she has been told he only has a few weeks/months left.    Currently in Pain?  No/denies           Pt reports that on Tuesday 10/11/18 she went to [facility] to visit her husband and got upset because he was wearing the same clothes that he had on a week ago. She resolved the issue with the supervisor and then went to [employer] and did a workout and used the massage bed. She left [employer] and went to [employer]. She was shopping and she suddenly felt like something "hit me in the head from the inside out." She was able to make it through the check-out lane and barely made it to the car. Pt states that she felt dizzy and states that "it felt like I was going to spin." She states that she threw up about three times after that. Her daughter-in-law came to pick her up. Her daughter in law took her home and her son met them there.  She took some Zofran and rested and her symptoms eventually improved but never completely resolved. She denies any numbness/tingling, focal weakness, dysphagia, dysarthria, or changes in vision during the episode. She did not seek medical attention as her severe symptoms resolved in about 30 minutes. Pt encouraged by therapist to communicate this event to her neurologist/PCP and follow-up with her PCP or neurologist. Therapist spent approximately 1 hour talking with patient, daughter, and daughter-in-law. Encouraged them to continue close follow-up with neurology. Advised them to treat every event as an emergency unless otherwise counseled by MD. Pt with remote history of CVA. Revewied BE FAST acronym related to CVA and discussed the importance of time in relation to tPA administration. Pt  is currently under care of neurology for vertebrobasilar/vestibular/complex? Migraines. They are trying to determine the etiology of these acute episodes. Per pt, PCP is suggesting these events could be TIAs. No therapy performed with patient on this date. Current acute symptoms do not appear to be related to peripheral etiology. Pt will be discharged on this date having met her primary goals for vestibular therapy. Her balance has improved significantly and at last goals pt reported approximately 90% improved with respect to her chronic balance deficits.                        PT Education - 10/16/18 0804    Education Details  Discharge, follow-up with neurology   Daughter and daughter in law   Person(s) Educated  Patient;Child(ren)    Methods  Explanation    Comprehension  Verbalized understanding       PT Short Term Goals - 10/07/18 1051      PT SHORT TERM GOAL #1   Title  Pt will be independent with HEP in order to improve strength and balance in order to decrease fall risk and improve function at home and work.     Time  4    Period  Weeks    Status  Achieved        PT Long Term  Goals - 10/07/18 1051      PT LONG TERM GOAL #1   Title  Pt will improve FGA by at least 6 points in order to demonstrate clinically significant improvement in balance.     Baseline  08/16/18: 24/30; 09/23/18: 27/30    Time  8    Period  Weeks    Status  On-going    Target Date  11/18/18      PT LONG TERM GOAL #2   Title  Pt will decrease DHI score by at least 18 points in order to demonstrate clinically significant reduction in disability     Baseline  08/16/18: 40/100; 09/23/18: 12/100    Time  8    Period  Weeks    Status  Achieved      PT LONG TERM GOAL #3   Title  Pt will report at least 50% improvement in symptoms with vestibular therapy in order to demonstrate significant reduction in symptoms    Baseline  09/23/18: 90-100% improvement    Time  8    Period  Weeks    Status  Achieved            Plan - 10/16/18 0805    Clinical Impression Statement  Pt reports that on Tuesday 10/11/18 she went to [facility] to visit her husband and got upset because he was wearing the same clothes that he had on a week ago. She resolved the issue with the supervisor and then went to [employer] and did a workout and used the massage bed. She left [employer] and went to [employer]. She was shopping and she suddenly felt like something "hit me in the head from the inside out." She was able to make it through the check-out lane and barely made it to the car. Pt states that she felt dizzy and states that "it felt like I was going to spin." She states that she threw up about three times after that. Her daughter-in-law came to pick her up. Her daughter in law took her home and her son met them there. She took some Zofran and rested and  her symptoms eventually improved but never completely resolved. She denies any numbness/tingling, focal weakness, dysphagia, dysarthria, or changes in vision during the episode. She did not seek medical attention as her severe symptoms resolved in about 30 minutes.  Pt encouraged by therapist to communicate this event to her neurologist/PCP and follow-up with her PCP or neurologist. Therapist spent approximately 1 hour talking with patient, daughter, and daughter-in-law. Encouraged them to continue close follow-up with neurology. Advised them to treat every event as an emergency unless otherwise counseled by MD. Pt with remote history of CVA. Revewied BE FAST acronym related to CVA and discussed the importance of time in relation to tPA administration. Pt is currently under care of neurology for vertebrobasilar/vestibular/complex? Migraines. They are trying to determine the etiology of these acute episodes. Per pt, PCP is suggesting these events could be TIAs. No therapy performed with patient on this date. Current acute symptoms do not appear to be related to peripheral etiology. Pt will be discharged on this date having met her primary goals for vestibular therapy. Her balance has improved significantly and at last goals pt reported approximately 90% improved with respect to her chronic balance deficits.    Clinical Decision Making  High    Rehab Potential  Good    PT Frequency  1x / week    PT Duration  6 weeks    PT Treatment/Interventions  ADLs/Self Care Home Management;Aquatic Therapy;Biofeedback;Canalith Repostioning;Electrical Stimulation;Iontophoresis 44m/ml Dexamethasone;Moist Heat;Ultrasound;DME Instruction;Gait training;Stair training;Functional mobility training;Therapeutic activities;Therapeutic exercise;Balance training;Neuromuscular re-education;Patient/family education;Manual techniques;Dry needling;Vestibular    PT Next Visit Plan  Discharge    PT Home Exercise Plan  VOR x 1 horizontal 60s x 3 QID in standing with conflicting background, Semitandem/tandem horizontal head turns, independent gym program at [employer] Agree with Plan of Care  Patient       Patient will benefit from skilled therapeutic intervention in order to  improve the following deficits and impairments:  Dizziness, Decreased balance  Visit Diagnosis: Dizziness and giddiness  Unsteadiness on feet     Problem List Patient Active Problem List   Diagnosis Date Noted  . Ataxia 08/09/2018   JPhillips GroutPT, DPT, GCS  Huprich,Jason 10/16/2018, 8:20 AM  [place] [address] [place] [address]Phone: [phone]  Fax:  [phone] Name: VWania LongstrethMRN: [id number] of Birth: 403/23/1937

## 2018-12-30 ENCOUNTER — Other Ambulatory Visit: Payer: Self-pay | Admitting: Family Medicine

## 2018-12-30 DIAGNOSIS — Z1231 Encounter for screening mammogram for malignant neoplasm of breast: Secondary | ICD-10-CM

## 2019-01-16 ENCOUNTER — Ambulatory Visit
Admission: RE | Admit: 2019-01-16 | Discharge: 2019-01-16 | Disposition: A | Discharge status: Home / Self Care | Payer: Medicare Other | Source: Ambulatory Visit | Attending: Family Medicine | Admitting: Family Medicine

## 2019-01-16 DIAGNOSIS — Z1231 Encounter for screening mammogram for malignant neoplasm of breast: Secondary | ICD-10-CM | POA: Insufficient documentation

## 2019-12-22 DIAGNOSIS — C50412 Malignant neoplasm of upper-outer quadrant of left female breast: Secondary | ICD-10-CM | POA: Insufficient documentation

## 2019-12-22 DIAGNOSIS — Z170 Estrogen receptor positive status [ER+]: Secondary | ICD-10-CM | POA: Insufficient documentation

## 2020-04-15 IMAGING — CT CT HEAD W/O CM
1 series · 16 of 30 positions shown, 20 images · non-contrast
Comparison: 09/13/2006

CLINICAL DATA: Dizziness.  Diminished hearing.

EXAM:
CT HEAD WITHOUT CONTRAST
TECHNIQUE: Contiguous axial images were obtained from the base of the skull
through the vertex without intravenous contrast.

[Series 2: head wo · axial · 0.38mm/px · z∈[-74,+61]mm · 16 of 30 slices shown, 20 images]
[im 2/30  brain]
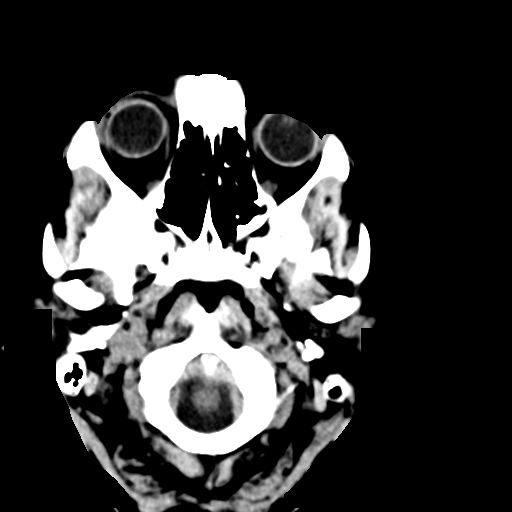
[im 2/30  bone]
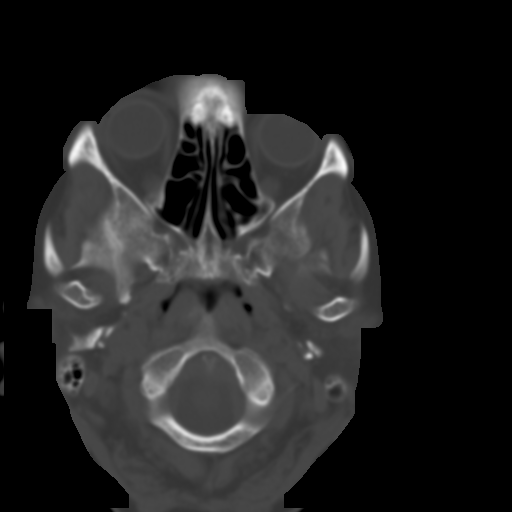
[im 4/30  brain]
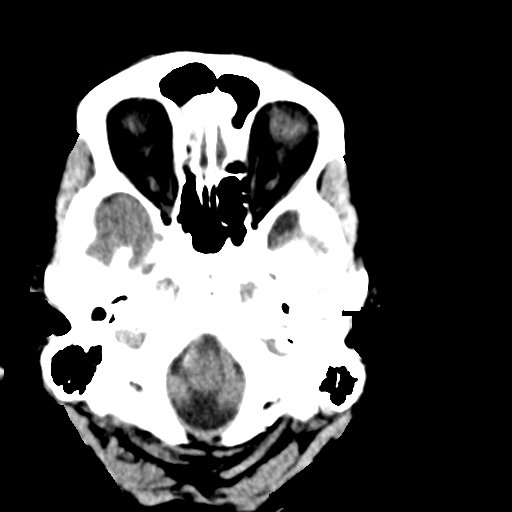
[im 6/30  brain]
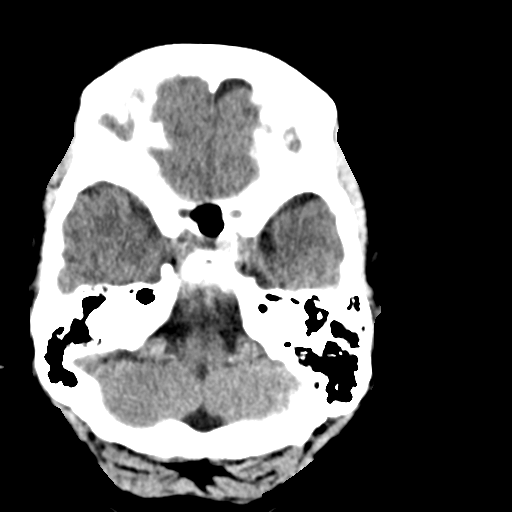
[im 8/30  brain]
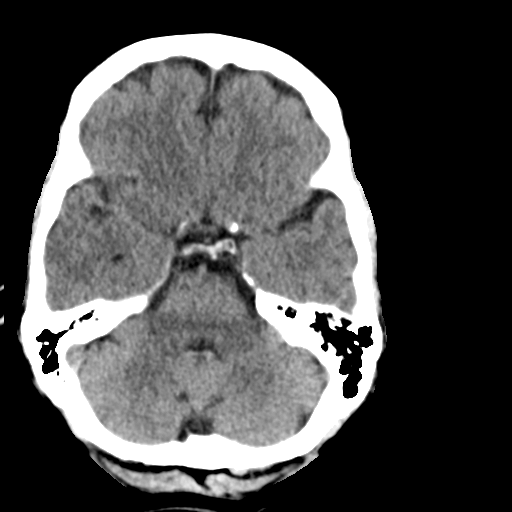
[im 9/30  brain]
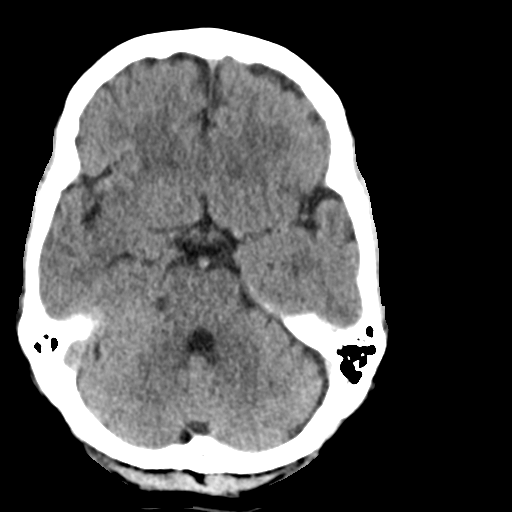
[im 9/30  bone]
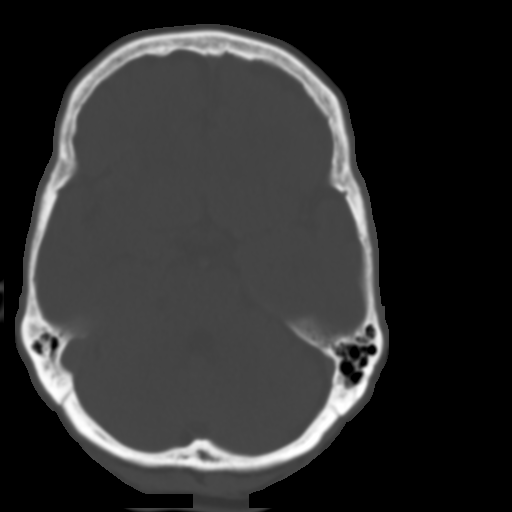
[im 11/30  brain]
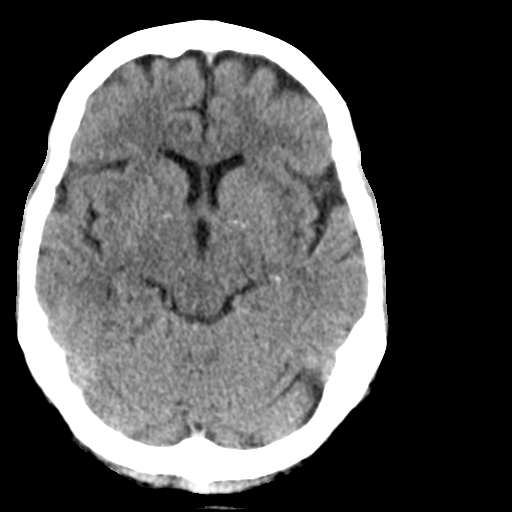
[im 13/30  brain]
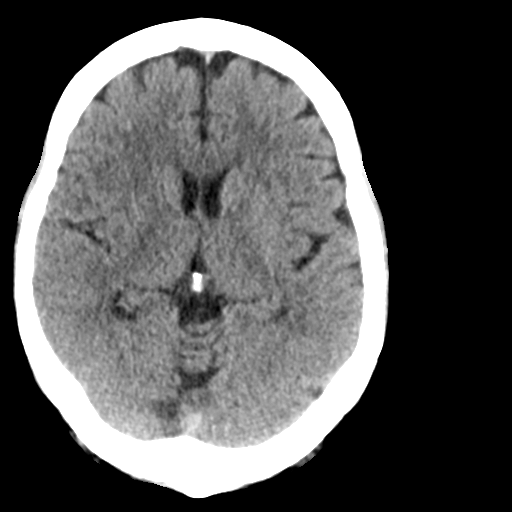
[im 15/30  brain]
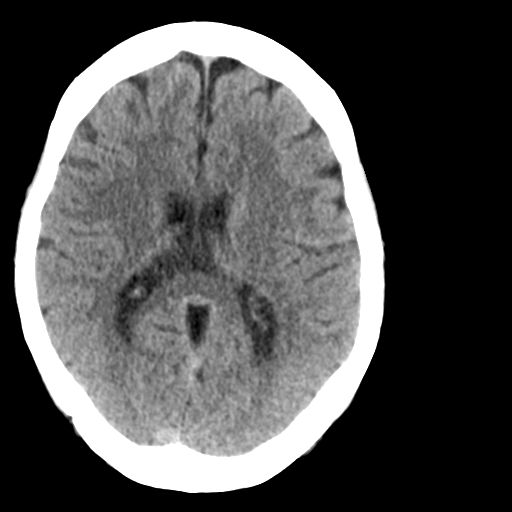
[im 16/30  brain]
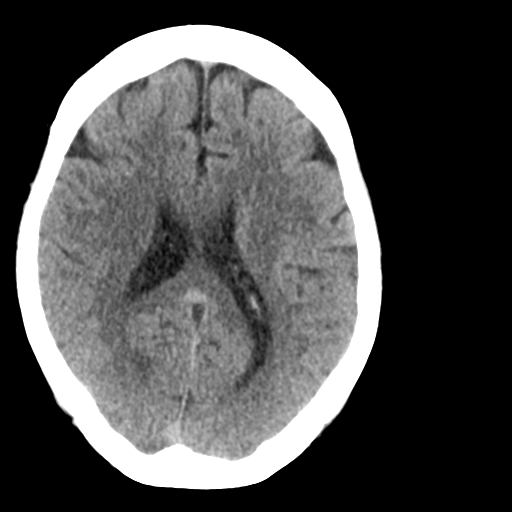
[im 16/30  bone]
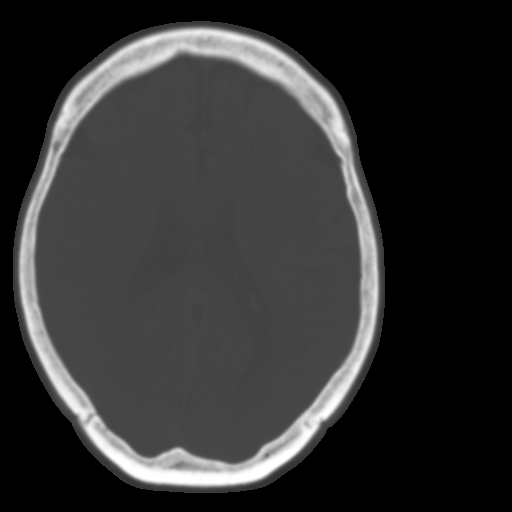
[im 18/30  brain]
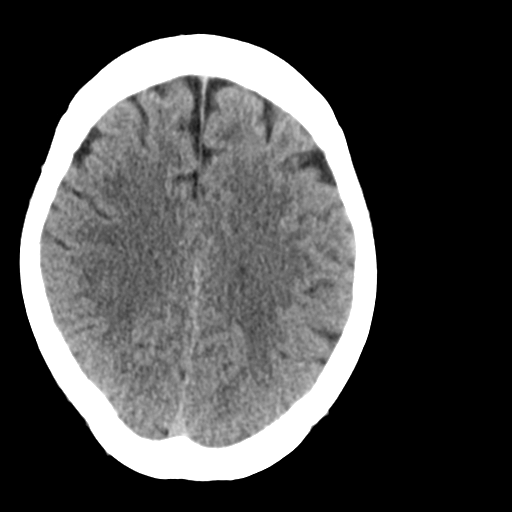
[im 20/30  brain]
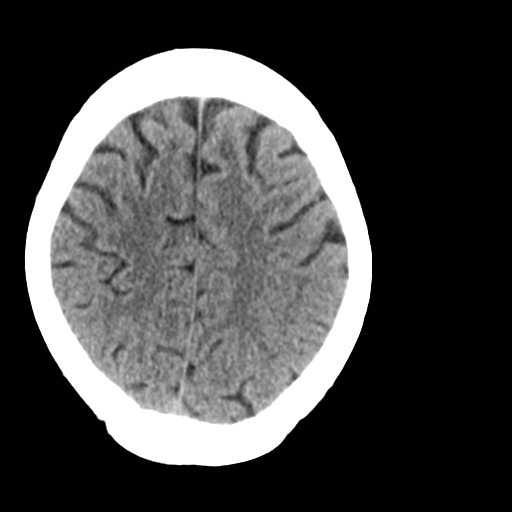
[im 22/30  brain]
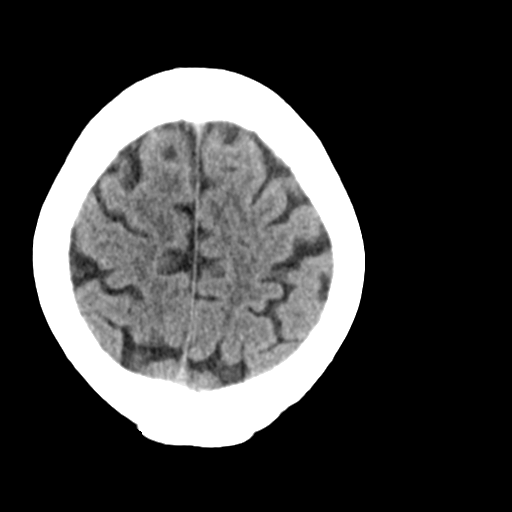
[im 23/30  brain]
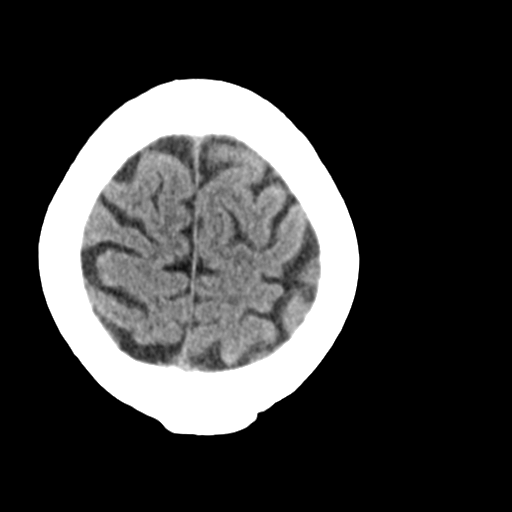
[im 23/30  bone]
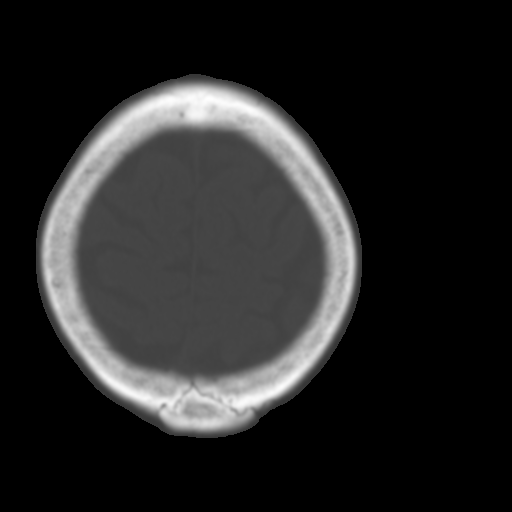
[im 25/30  brain]
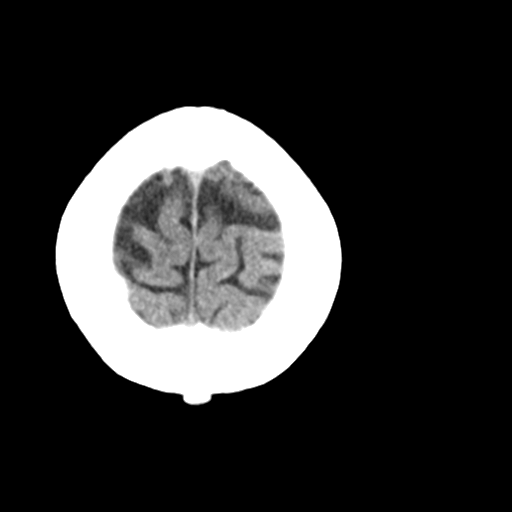
[im 27/30  brain]
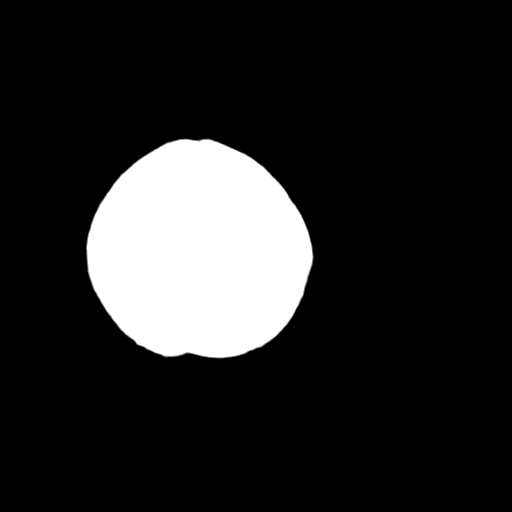
[im 29/30  brain]
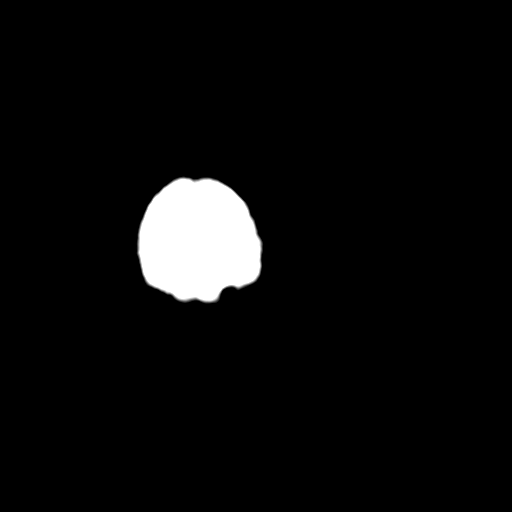

[16 of 30 positions shown; findings below may reference images not displayed]

FINDINGS: Brain: No evidence of acute infarction, hemorrhage, hydrocephalus,
extra-axial collection or mass lesion/mass effect.

Vascular: Mild calcific atherosclerotic disease at the skull base.

Skull: Normal. Negative for fracture or focal lesion.

Sinuses/Orbits: No acute finding.

Other: None.
IMPRESSION: No acute intracranial abnormality.

## 2020-10-16 ENCOUNTER — Encounter: Payer: Self-pay | Admitting: Ophthalmology

## 2020-10-16 ENCOUNTER — Other Ambulatory Visit: Payer: Self-pay

## 2020-10-18 ENCOUNTER — Other Ambulatory Visit
Admission: RE | Admit: 2020-10-18 | Discharge: 2020-10-18 | Disposition: A | Discharge status: Home / Self Care | Payer: Medicare Other | Source: Ambulatory Visit | Attending: Ophthalmology | Admitting: Ophthalmology

## 2020-10-18 ENCOUNTER — Other Ambulatory Visit: Payer: Self-pay

## 2020-10-18 DIAGNOSIS — Z01812 Encounter for preprocedural laboratory examination: Secondary | ICD-10-CM | POA: Diagnosis present

## 2020-10-18 DIAGNOSIS — Z20822 Contact with and (suspected) exposure to covid-19: Secondary | ICD-10-CM | POA: Diagnosis not present

## 2020-10-18 LAB — SARS CORONAVIRUS 2 (TAT 6-24 HRS): SARS Coronavirus 2: NEGATIVE

## 2020-10-18 NOTE — Discharge Instructions (Signed)

## 2020-10-22 ENCOUNTER — Other Ambulatory Visit: Payer: Self-pay

## 2020-10-22 ENCOUNTER — Ambulatory Visit
Admission: RE | Admit: 2020-10-22 | Discharge: 2020-10-22 | Disposition: A | Discharge status: Home / Self Care | Payer: Medicare Other | Source: Ambulatory Visit | Attending: Ophthalmology | Admitting: Ophthalmology

## 2020-10-22 ENCOUNTER — Encounter: Payer: Self-pay | Admitting: Ophthalmology

## 2020-10-22 ENCOUNTER — Ambulatory Visit: Payer: Medicare Other | Admitting: Anesthesiology

## 2020-10-22 ENCOUNTER — Encounter
Admission: RE | Disposition: A | Discharge status: Home / Self Care | Payer: Self-pay | Source: Ambulatory Visit | Attending: Ophthalmology

## 2020-10-22 DIAGNOSIS — Z79899 Other long term (current) drug therapy: Secondary | ICD-10-CM | POA: Diagnosis not present

## 2020-10-22 DIAGNOSIS — H2512 Age-related nuclear cataract, left eye: Secondary | ICD-10-CM | POA: Diagnosis not present

## 2020-10-22 DIAGNOSIS — Z923 Personal history of irradiation: Secondary | ICD-10-CM | POA: Diagnosis not present

## 2020-10-22 DIAGNOSIS — Z8673 Personal history of transient ischemic attack (TIA), and cerebral infarction without residual deficits: Secondary | ICD-10-CM | POA: Diagnosis not present

## 2020-10-22 DIAGNOSIS — Z7901 Long term (current) use of anticoagulants: Secondary | ICD-10-CM | POA: Insufficient documentation

## 2020-10-22 DIAGNOSIS — Z853 Personal history of malignant neoplasm of breast: Secondary | ICD-10-CM | POA: Insufficient documentation

## 2020-10-22 HISTORY — DX: Chronic kidney disease, stage 3 unspecified: N18.30

## 2020-10-22 HISTORY — PX: CATARACT EXTRACTION W/PHACO: SHX586

## 2020-10-22 HISTORY — DX: Malignant (primary) neoplasm, unspecified: C80.1

## 2020-10-22 HISTORY — DX: Essential (primary) hypertension: I10

## 2020-10-22 SURGERY — PHACOEMULSIFICATION, CATARACT, WITH IOL INSERTION
Anesthesia: Monitor Anesthesia Care | Site: Eye | Laterality: Left

## 2020-10-22 MED ORDER — NA HYALUR & NA CHOND-NA HYALUR 0.4-0.35 ML IO KIT
PACK | INTRAOCULAR | Status: DC | PRN
  Administered 2020-10-22: 1 mL via INTRAOCULAR

## 2020-10-22 MED ORDER — ACETAMINOPHEN 325 MG PO TABS: 325.0000 mg | ORAL_TABLET | Freq: Once | ORAL | Status: DC

## 2020-10-22 MED ORDER — ACETAMINOPHEN 160 MG/5ML PO SOLN: 325.0000 mg | Freq: Once | ORAL | Status: DC

## 2020-10-22 MED ORDER — MIDAZOLAM HCL 2 MG/2ML IJ SOLN
INTRAMUSCULAR | Status: DC | PRN
  Administered 2020-10-22: 1 mg via INTRAVENOUS

## 2020-10-22 MED ORDER — FENTANYL CITRATE (PF) 100 MCG/2ML IJ SOLN
INTRAMUSCULAR | Status: DC | PRN
  Administered 2020-10-22: 50 ug via INTRAVENOUS

## 2020-10-22 MED ORDER — LIDOCAINE HCL (PF) 2 % IJ SOLN
INTRAOCULAR | Status: DC | PRN
  Administered 2020-10-22: 1 mL

## 2020-10-22 MED ORDER — BRIMONIDINE TARTRATE-TIMOLOL 0.2-0.5 % OP SOLN
OPHTHALMIC | Status: DC | PRN
  Administered 2020-10-22: 1 [drp] via OPHTHALMIC

## 2020-10-22 MED ORDER — EPINEPHRINE PF 1 MG/ML IJ SOLN
INTRAOCULAR | Status: DC | PRN
  Administered 2020-10-22: 83 mL via OPHTHALMIC

## 2020-10-22 MED ORDER — CEFUROXIME OPHTHALMIC INJECTION 1 MG/0.1 ML
INJECTION | OPHTHALMIC | Status: DC | PRN
  Administered 2020-10-22: 0.1 mL via INTRACAMERAL

## 2020-10-22 MED ORDER — LACTATED RINGERS IV SOLN: INTRAVENOUS | Status: DC

## 2020-10-22 MED ORDER — ARMC OPHTHALMIC DILATING DROPS
1.0000 "application " | OPHTHALMIC | Status: DC | PRN
  Administered 2020-10-22 (×3): 1 via OPHTHALMIC

## 2020-10-22 MED ORDER — MOXIFLOXACIN HCL 0.5 % OP SOLN
1.0000 [drp] | OPHTHALMIC | Status: DC | PRN
  Administered 2020-10-22 (×3): 1 [drp] via OPHTHALMIC

## 2020-10-22 MED ORDER — TETRACAINE HCL 0.5 % OP SOLN
1.0000 [drp] | OPHTHALMIC | Status: DC | PRN
  Administered 2020-10-22 (×3): 1 [drp] via OPHTHALMIC

## 2020-10-22 SURGICAL SUPPLY — 22 items
CANNULA ANT/CHMB 27G (MISCELLANEOUS) ×1 IMPLANT
CANNULA ANT/CHMB 27GA (MISCELLANEOUS) ×3 IMPLANT
GLOVE SURG LX 7.5 STRW (GLOVE) ×2
GLOVE SURG LX STRL 7.5 STRW (GLOVE) ×1 IMPLANT
GLOVE SURG TRIUMPH 8.0 PF LTX (GLOVE) ×3 IMPLANT
GOWN STRL REUS W/ TWL LRG LVL3 (GOWN DISPOSABLE) ×2 IMPLANT
GOWN STRL REUS W/TWL LRG LVL3 (GOWN DISPOSABLE) ×6
LENS IOL TECNIS EYHANCE 27.5 (Intraocular Lens) ×2 IMPLANT
MARKER SKIN DUAL TIP RULER LAB (MISCELLANEOUS) ×3 IMPLANT
NDL CAPSULORHEX 25GA (NEEDLE) ×1 IMPLANT
NDL FILTER BLUNT 18X1 1/2 (NEEDLE) ×2 IMPLANT
NEEDLE CAPSULORHEX 25GA (NEEDLE) ×3 IMPLANT
NEEDLE FILTER BLUNT 18X 1/2SAF (NEEDLE) ×4
NEEDLE FILTER BLUNT 18X1 1/2 (NEEDLE) ×2 IMPLANT
PACK CATARACT BRASINGTON (MISCELLANEOUS) ×3 IMPLANT
PACK EYE AFTER SURG (MISCELLANEOUS) ×3 IMPLANT
PACK OPTHALMIC (MISCELLANEOUS) ×3 IMPLANT
SOLUTION OPHTHALMIC SALT (MISCELLANEOUS) ×3 IMPLANT
SYR 3ML LL SCALE MARK (SYRINGE) ×6 IMPLANT
SYR TB 1ML LUER SLIP (SYRINGE) ×3 IMPLANT
WATER STERILE IRR 250ML POUR (IV SOLUTION) ×3 IMPLANT
WIPE NON LINTING 3.25X3.25 (MISCELLANEOUS) ×3 IMPLANT

## 2020-10-22 NOTE — H&P (Signed)

## 2020-10-22 NOTE — H&P (Signed)

## 2020-10-22 NOTE — Anesthesia Postprocedure Evaluation (Signed)
Anesthesia Post Note  Patient: Traci Holland  Procedure(s) Performed: CATARACT EXTRACTION PHACO AND INTRAOCULAR LENS PLACEMENT (IOC) LEFT 10.03 01:30.4 11.1% (Left Eye)     Patient location during evaluation: PACU Anesthesia Type: MAC Level of consciousness: awake and alert and oriented Pain management: satisfactory to patient Vital Signs Assessment: post-procedure vital signs reviewed and stable Respiratory status: spontaneous breathing, nonlabored ventilation and respiratory function stable Cardiovascular status: blood pressure returned to baseline and stable Postop Assessment: Adequate PO intake and No signs of nausea or vomiting Anesthetic complications: no   No complications documented.  Raliegh Ip

## 2020-10-22 NOTE — Anesthesia Preprocedure Evaluation (Signed)
Anesthesia Evaluation  Patient identified by MRN, date of birth, ID band Patient awake    Reviewed: Allergy & Precautions, H&P , NPO status , Patient's Chart, lab work & pertinent test results  Airway Mallampati: II  TM Distance: >3 FB Neck ROM: full    Dental no notable dental hx.    Pulmonary shortness of breath and with exertion, asthma ,    Pulmonary exam normal breath sounds clear to auscultation       Cardiovascular hypertension, + dysrhythmias Atrial Fibrillation + Valvular Problems/Murmurs MVP  Rhythm:regular Rate:Normal  Awaiting echo and possible stress test for progressive DOE   Neuro/Psych    GI/Hepatic GERD  ,  Endo/Other    Renal/GU Renal disease     Musculoskeletal   Abdominal   Peds  Hematology   Anesthesia Other Findings   Reproductive/Obstetrics                             Anesthesia Physical Anesthesia Plan  ASA: III  Anesthesia Plan: MAC   Post-op Pain Management:    Induction:   PONV Risk Score and Plan: 2 and Treatment may vary due to age or medical condition, TIVA and Midazolam  Airway Management Planned:   Additional Equipment:   Intra-op Plan:   Post-operative Plan:   Informed Consent: I have reviewed the patients History and Physical, chart, labs and discussed the procedure including the risks, benefits and alternatives for the proposed anesthesia with the patient or authorized representative who has indicated his/her understanding and acceptance.     Dental Advisory Given  Plan Discussed with: CRNA  Anesthesia Plan Comments:         Anesthesia Quick Evaluation

## 2020-10-22 NOTE — Transfer of Care (Signed)
Immediate Anesthesia Transfer of Care Note  Patient: Traci Holland  Procedure(s) Performed: CATARACT EXTRACTION PHACO AND INTRAOCULAR LENS PLACEMENT (IOC) LEFT 10.03 01:30.4 11.1% (Left Eye)  Patient Location: PACU  Anesthesia Type: MAC  Level of Consciousness: awake, alert  and patient cooperative  Airway and Oxygen Therapy: Patient Spontanous Breathing and Patient connected to supplemental oxygen  Post-op Assessment: Post-op Vital signs reviewed, Patient's Cardiovascular Status Stable, Respiratory Function Stable, Patent Airway and No signs of Nausea or vomiting  Post-op Vital Signs: Reviewed and stable  Complications: No complications documented.

## 2020-10-22 NOTE — Op Note (Signed)
OPERATIVE NOTE  Traci Holland [id number] 10/22/2020   PREOPERATIVE DIAGNOSIS:  Nuclear sclerotic cataract left eye. H25.12   POSTOPERATIVE DIAGNOSIS:    Nuclear sclerotic cataract left eye.     PROCEDURE:  Phacoemusification with posterior chamber intraocular lens placement of the left eye  Ultrasound time: Procedure(s): CATARACT EXTRACTION PHACO AND INTRAOCULAR LENS PLACEMENT (IOC) LEFT 10.03 01:30.4 11.1% (Left)  LENS:   Implant Name Type Inv. Item Serial No. Manufacturer Lot No. LRB No. Used Action  LENS IOL TECNIS EYHANCE 27.5 - [id number] Intraocular Lens LENS IOL TECNIS EYHANCE 27.5 [id number] JOHNSON   Left 1 Implanted      SURGEON:  Wyonia Hough, MD   ANESTHESIA:  Topical with tetracaine drops and 2% Xylocaine jelly, augmented with 1% preservative-free intracameral lidocaine.    COMPLICATIONS:  None.   DESCRIPTION OF PROCEDURE:  The patient was identified in the holding room and transported to the operating room and placed in the supine position under the operating microscope.  The left eye was identified as the operative eye and it was prepped and draped in the usual sterile ophthalmic fashion.   A 1 millimeter clear-corneal paracentesis was made at the 1:30 position.  0.5 ml of preservative-free 1% lidocaine was injected into the anterior chamber.  The anterior chamber was filled with Viscoat viscoelastic.  A 2.4 millimeter keratome was used to make a near-clear corneal incision at the 10:30 position.  .  A curvilinear capsulorrhexis was made with a cystotome and capsulorrhexis forceps.  Balanced salt solution was used to hydrodissect and hydrodelineate the nucleus.   Phacoemulsification was then used in stop and chop fashion to remove the lens nucleus and epinucleus.  The remaining cortex was then removed using the irrigation and aspiration handpiece. Provisc was then placed into the capsular bag to distend it for lens placement.  A lens was then injected into the  capsular bag.  The remaining viscoelastic was aspirated.   Wounds were hydrated with balanced salt solution.  The anterior chamber was inflated to a physiologic pressure with balanced salt solution.  No wound leaks were noted. Cefuroxime 0.1 ml of a 10mg /ml solution was injected into the anterior chamber for a dose of 1 mg of intracameral antibiotic at the completion of the case.   Timolol and Brimonidine drops were applied to the eye.  The patient was taken to the recovery room in stable condition without complications of anesthesia or surgery.  [name] 10/22/2020, 9:25 AM

## 2020-10-22 NOTE — Anesthesia Procedure Notes (Signed)
Procedure Name: MAC Date/Time: 10/22/2020 9:19 AM Performed by: Cameron Ali, CRNA Pre-anesthesia Checklist: Patient identified, Emergency Drugs available, Suction available, Timeout performed and Patient being monitored Patient Re-evaluated:Patient Re-evaluated prior to induction Oxygen Delivery Method: Nasal cannula Placement Confirmation: positive ETCO2

## 2020-10-23 ENCOUNTER — Encounter: Payer: Self-pay | Admitting: Ophthalmology

## 2020-11-04 ENCOUNTER — Other Ambulatory Visit: Payer: Self-pay

## 2020-11-04 ENCOUNTER — Encounter: Payer: Self-pay | Admitting: Ophthalmology

## 2020-11-11 ENCOUNTER — Other Ambulatory Visit: Payer: Self-pay

## 2020-11-11 ENCOUNTER — Other Ambulatory Visit
Admission: RE | Admit: 2020-11-11 | Discharge: 2020-11-11 | Disposition: A | Discharge status: Home / Self Care | Payer: Medicare Other | Source: Ambulatory Visit | Attending: Ophthalmology | Admitting: Ophthalmology

## 2020-11-11 DIAGNOSIS — Z20822 Contact with and (suspected) exposure to covid-19: Secondary | ICD-10-CM | POA: Diagnosis not present

## 2020-11-11 DIAGNOSIS — Z01812 Encounter for preprocedural laboratory examination: Secondary | ICD-10-CM | POA: Insufficient documentation

## 2020-11-11 LAB — SARS CORONAVIRUS 2 (TAT 6-24 HRS): SARS Coronavirus 2: NEGATIVE

## 2020-11-12 MED ORDER — ARMC OPHTHALMIC DILATING DROPS
1.0000 "application " | OPHTHALMIC | Status: DC | PRN
  Administered 2020-11-13 (×3): 1 via OPHTHALMIC

## 2020-11-12 MED ORDER — TETRACAINE HCL 0.5 % OP SOLN
1.0000 [drp] | OPHTHALMIC | Status: DC | PRN
  Administered 2020-11-13 (×3): 1 [drp] via OPHTHALMIC

## 2020-11-12 MED ORDER — SODIUM CHLORIDE 0.9 % IV SOLN: INTRAVENOUS | Status: DC

## 2020-11-13 ENCOUNTER — Ambulatory Visit
Admission: RE | Admit: 2020-11-13 | Discharge: 2020-11-13 | Disposition: A | Discharge status: Home / Self Care | Payer: Medicare Other | Source: Ambulatory Visit | Attending: Ophthalmology | Admitting: Ophthalmology

## 2020-11-13 ENCOUNTER — Ambulatory Visit: Payer: Medicare Other | Admitting: Anesthesiology

## 2020-11-13 ENCOUNTER — Other Ambulatory Visit: Payer: Self-pay

## 2020-11-13 ENCOUNTER — Encounter
Admission: RE | Disposition: A | Discharge status: Home / Self Care | Payer: Self-pay | Source: Ambulatory Visit | Attending: Ophthalmology

## 2020-11-13 ENCOUNTER — Ambulatory Visit: Admission: RE | Admit: 2020-11-13 | Payer: Medicare Other | Source: Home / Self Care | Admitting: Ophthalmology

## 2020-11-13 ENCOUNTER — Encounter: Payer: Self-pay | Admitting: Ophthalmology

## 2020-11-13 DIAGNOSIS — J45909 Unspecified asthma, uncomplicated: Secondary | ICD-10-CM | POA: Diagnosis not present

## 2020-11-13 DIAGNOSIS — I480 Paroxysmal atrial fibrillation: Secondary | ICD-10-CM | POA: Diagnosis not present

## 2020-11-13 DIAGNOSIS — Z853 Personal history of malignant neoplasm of breast: Secondary | ICD-10-CM | POA: Insufficient documentation

## 2020-11-13 DIAGNOSIS — H2511 Age-related nuclear cataract, right eye: Secondary | ICD-10-CM | POA: Diagnosis not present

## 2020-11-13 DIAGNOSIS — K219 Gastro-esophageal reflux disease without esophagitis: Secondary | ICD-10-CM | POA: Insufficient documentation

## 2020-11-13 DIAGNOSIS — Z8673 Personal history of transient ischemic attack (TIA), and cerebral infarction without residual deficits: Secondary | ICD-10-CM | POA: Diagnosis not present

## 2020-11-13 DIAGNOSIS — I129 Hypertensive chronic kidney disease with stage 1 through stage 4 chronic kidney disease, or unspecified chronic kidney disease: Secondary | ICD-10-CM | POA: Insufficient documentation

## 2020-11-13 DIAGNOSIS — N183 Chronic kidney disease, stage 3 unspecified: Secondary | ICD-10-CM | POA: Diagnosis not present

## 2020-11-13 HISTORY — PX: CATARACT EXTRACTION W/PHACO: SHX586

## 2020-11-13 SURGERY — PHACOEMULSIFICATION, CATARACT, WITH IOL INSERTION
Anesthesia: Monitor Anesthesia Care | Site: Eye | Laterality: Right

## 2020-11-13 MED ORDER — FENTANYL CITRATE (PF) 100 MCG/2ML IJ SOLN
INTRAMUSCULAR | Status: AC
  Filled 2020-11-13: qty 2

## 2020-11-13 MED ORDER — MIDAZOLAM HCL 2 MG/2ML IJ SOLN
INTRAMUSCULAR | Status: DC | PRN
  Administered 2020-11-13 (×2): 1 mg via INTRAVENOUS

## 2020-11-13 MED ORDER — FENTANYL CITRATE (PF) 100 MCG/2ML IJ SOLN
INTRAMUSCULAR | Status: DC | PRN
  Administered 2020-11-13 (×2): 50 ug via INTRAVENOUS

## 2020-11-13 MED ORDER — CEFUROXIME OPHTHALMIC INJECTION 1 MG/0.1 ML
INJECTION | OPHTHALMIC | Status: DC | PRN
  Administered 2020-11-13: 0.1 mL via INTRACAMERAL

## 2020-11-13 MED ORDER — BRIMONIDINE TARTRATE-TIMOLOL 0.2-0.5 % OP SOLN
OPHTHALMIC | Status: DC | PRN
  Administered 2020-11-13: 1 [drp] via OPHTHALMIC

## 2020-11-13 MED ORDER — LIDOCAINE HCL (PF) 2 % IJ SOLN
INTRAOCULAR | Status: DC | PRN
  Administered 2020-11-13: 1 mL

## 2020-11-13 MED ORDER — EPINEPHRINE PF 1 MG/ML IJ SOLN
INTRAOCULAR | Status: DC | PRN
  Administered 2020-11-13: 65 mL via OPHTHALMIC

## 2020-11-13 MED ORDER — MIDAZOLAM HCL 2 MG/2ML IJ SOLN
INTRAMUSCULAR | Status: AC
  Filled 2020-11-13: qty 2

## 2020-11-13 MED ORDER — NA HYALUR & NA CHOND-NA HYALUR 0.4-0.35 ML IO KIT
PACK | INTRAOCULAR | Status: DC | PRN
  Administered 2020-11-13: 1 mL via INTRAOCULAR

## 2020-11-13 SURGICAL SUPPLY — 22 items
CANNULA ANT/CHMB 27G (MISCELLANEOUS) IMPLANT
CANNULA ANT/CHMB 27GA (MISCELLANEOUS) ×3 IMPLANT
GLOVE BIO SURGEON STRL SZ8 (GLOVE) ×3 IMPLANT
GLOVE BIOGEL M 6.5 STRL (GLOVE) ×3 IMPLANT
GLOVE SURG LX 7.5 STRW (GLOVE) ×2
GLOVE SURG LX STRL 7.5 STRW (GLOVE) ×1 IMPLANT
GOWN STRL REUS W/ TWL LRG LVL3 (GOWN DISPOSABLE) ×2 IMPLANT
GOWN STRL REUS W/TWL LRG LVL3 (GOWN DISPOSABLE) ×6
LENS IOL TECNIS EYHANCE 27.5 (Intraocular Lens) ×2 IMPLANT
MARKER SKIN DUAL TIP RULER LAB (MISCELLANEOUS) ×2 IMPLANT
NDL CAPSULORHEX 25GA (NEEDLE) IMPLANT
NDL FILTER BLUNT 18X1 1/2 (NEEDLE) IMPLANT
NEEDLE CAPSULORHEX 25GA (NEEDLE) ×3 IMPLANT
NEEDLE FILTER BLUNT 18X 1/2SAF (NEEDLE) ×4
NEEDLE FILTER BLUNT 18X1 1/2 (NEEDLE) ×2 IMPLANT
PACK CATARACT (MISCELLANEOUS) ×3 IMPLANT
PACK CATARACT BRASINGTON LX (MISCELLANEOUS) ×3 IMPLANT
PACK EYE AFTER SURG (MISCELLANEOUS) ×3 IMPLANT
SYR 3ML LL SCALE MARK (SYRINGE) ×4 IMPLANT
SYR TB 1ML LUER SLIP (SYRINGE) ×2 IMPLANT
WATER STERILE IRR 250ML POUR (IV SOLUTION) ×3 IMPLANT
WIPE NON LINTING 3.25X3.25 (MISCELLANEOUS) ×3 IMPLANT

## 2020-11-13 NOTE — Transfer of Care (Signed)
Immediate Anesthesia Transfer of Care Note  Patient: Traci Holland  Procedure(s) Performed: CATARACT EXTRACTION PHACO AND INTRAOCULAR LENS PLACEMENT (IOC) RIGHT (Right Eye)  Patient Location: PACU and Short Stay  Anesthesia Type:MAC  Level of Consciousness: awake  Airway & Oxygen Therapy: Patient Spontanous Breathing  Post-op Assessment: Report given to RN  Post vital signs: stable  Last Vitals:  Vitals Value Taken Time  BP    Temp    Pulse    Resp    SpO2      Last Pain:  Vitals:   11/13/20 1348  TempSrc: Temporal  PainSc: 6          Complications: No complications documented.

## 2020-11-13 NOTE — Anesthesia Preprocedure Evaluation (Addendum)
Anesthesia Evaluation  Patient identified by MRN, date of birth, ID band Patient awake    Reviewed: Allergy & Precautions, NPO status , Patient's Chart, lab work & pertinent test results  Airway Mallampati: II       Dental no notable dental hx. (+) Teeth Intact   Pulmonary asthma ,    Pulmonary exam normal breath sounds clear to auscultation       Cardiovascular hypertension, negative cardio ROS Normal cardiovascular exam Rhythm:Regular Rate:Normal + Diastolic murmurs    Neuro/Psych  Headaches, CVA negative psych ROS   GI/Hepatic Neg liver ROS, hiatal hernia, GERD  ,  Endo/Other  negative endocrine ROS  Renal/GU CRFRenal disease  negative genitourinary   Musculoskeletal negative musculoskeletal ROS (+)   Abdominal   Peds negative pediatric ROS (+)  Hematology negative hematology ROS (+)   Anesthesia Other Findings .Marland KitchenPast Medical History: No date: Asthma No date: Cancer [facility])     Comment:  breast ca  08/2019 No date: Chronic kidney disease (CKD), stage III (moderate) (HCC) No date: Complication of anesthesia     Comment:  hard to get to sleep then slow to wake for breast               biopsy. No date: Dyspnea No date: Elevated lipids No date: GERD (gastroesophageal reflux disease) No date: Headache     Comment:  Migraines No date: History of hiatal hernia No date: Hypertension No date: Lung nodule No date: Mitral valve disorder No date: PAF (paroxysmal atrial fibrillation) ([place])     Comment:  ablation 2013 2013: Stroke [facility])     Comment:  no deficits No date: Vertigo     Comment:  last episode 2-3 yrs ago No date: Wears hearing aid in both ears  Reproductive/Obstetrics negative OB ROS                            Anesthesia Physical Anesthesia Plan  ASA: III  Anesthesia Plan: MAC   Post-op Pain Management:    Induction: Intravenous  PONV Risk Score and Plan: 2  Airway  Management Planned: Nasal Cannula  Additional Equipment:   Intra-op Plan:   Post-operative Plan:   Informed Consent: I have reviewed the patients History and Physical, chart, labs and discussed the procedure including the risks, benefits and alternatives for the proposed anesthesia with the patient or authorized representative who has indicated his/her understanding and acceptance.       Plan Discussed with: CRNA, Anesthesiologist and Surgeon  Anesthesia Plan Comments:         Anesthesia Quick Evaluation

## 2020-11-13 NOTE — Anesthesia Postprocedure Evaluation (Signed)
Anesthesia Post Note  Patient: Traci Holland  Procedure(s) Performed: CATARACT EXTRACTION PHACO AND INTRAOCULAR LENS PLACEMENT (IOC) RIGHT (Right Eye)  Patient location during evaluation: Phase II Anesthesia Type: MAC Level of consciousness: awake Pain management: pain level controlled Vital Signs Assessment: post-procedure vital signs reviewed and stable Respiratory status: spontaneous breathing Anesthetic complications: no   No complications documented.   Last Vitals:  Vitals:   11/13/20 1348  BP: 132/72  Pulse: (!) 58  Temp: 36.4 C  SpO2: 100%    Last Pain:  Vitals:   11/13/20 1348  TempSrc: Temporal  PainSc: 6                  Lerry Liner

## 2020-11-13 NOTE — H&P (Signed)

## 2020-11-13 NOTE — Discharge Instructions (Signed)

## 2020-11-13 NOTE — Op Note (Signed)
  PREOPERATIVE DIAGNOSIS:    Nuclear sclerotic cataract right eye. H25.11   POSTOPERATIVE DIAGNOSIS:  Nuclear sclerotic cataract right eye.     PROCEDURE:  Phacoemusification with posterior chamber intraocular lens placement of the right eye   ULTRASOUND TIME: Procedure(s) with comments: CATARACT EXTRACTION PHACO AND INTRAOCULAR LENS PLACEMENT (IOC) RIGHT (Right) - 10.08 1:20.4 13.6%  LENS:   Implant Name Type Inv. Item Serial No. Manufacturer Lot No. LRB No. Used Action  TECNIS EYHANCE IOL Intraocular Lens  [id number] Wynetta Emery [employer]  Right 1 Implanted         SURGEON:  Wyonia Hough, MD   ANESTHESIA:  Topical with tetracaine drops and 2% Xylocaine jelly, augmented with 1% preservative-free intracameral lidocaine.    COMPLICATIONS:  None.   DESCRIPTION OF PROCEDURE:  The patient was identified in the holding room and transported to the operating room and placed in the supine position under the operating microscope.  The right eye was identified as the operative eye and it was prepped and draped in the usual sterile ophthalmic fashion.   A 1 millimeter clear-corneal paracentesis was made at the 12:00 position.  0.5 ml of preservative-free 1% lidocaine was injected into the anterior chamber. The anterior chamber was filled with Viscoat viscoelastic.  A 2.4 millimeter keratome was used to make a near-clear corneal incision at the 9:00 position.  A curvilinear capsulorrhexis was made with a cystotome and capsulorrhexis forceps.  Balanced salt solution was used to hydrodissect and hydrodelineate the nucleus.   Phacoemulsification was then used in stop and chop fashion to remove the lens nucleus and epinucleus.  The remaining cortex was then removed using the irrigation and aspiration handpiece. Provisc was then placed into the capsular bag to distend it for lens placement.  A lens was then injected into the capsular bag.  The remaining viscoelastic was aspirated.   Wounds  were hydrated with balanced salt solution.  The anterior chamber was inflated to a physiologic pressure with balanced salt solution.  No wound leaks were noted. Cefuroxime 0.1 ml of a 10mg /ml solution was injected into the anterior chamber for a dose of 1 mg of intracameral antibiotic at the completion of the case.   Timolol and Brimonidine drops were applied to the eye.  The patient was taken to the recovery room in stable condition without complications of anesthesia or surgery.   Cova Knieriem 11/13/2020, 3:28 PM

## 2022-01-01 ENCOUNTER — Emergency Department: Payer: Medicare Other

## 2022-01-01 ENCOUNTER — Other Ambulatory Visit: Payer: Self-pay

## 2022-01-01 ENCOUNTER — Ambulatory Visit
Admission: EM | Admit: 2022-01-01 | Discharge: 2022-01-01 | Disposition: A | Discharge status: Home / Self Care | Payer: Medicare Other | Attending: Emergency Medicine | Admitting: Emergency Medicine

## 2022-01-01 ENCOUNTER — Encounter: Payer: Self-pay | Admitting: *Deleted

## 2022-01-01 DIAGNOSIS — Z7901 Long term (current) use of anticoagulants: Secondary | ICD-10-CM | POA: Diagnosis not present

## 2022-01-01 DIAGNOSIS — R0789 Other chest pain: Secondary | ICD-10-CM | POA: Diagnosis not present

## 2022-01-01 DIAGNOSIS — R079 Chest pain, unspecified: Secondary | ICD-10-CM

## 2022-01-01 DIAGNOSIS — M79604 Pain in right leg: Secondary | ICD-10-CM | POA: Insufficient documentation

## 2022-01-01 DIAGNOSIS — M79661 Pain in right lower leg: Secondary | ICD-10-CM | POA: Diagnosis not present

## 2022-01-01 DIAGNOSIS — I4891 Unspecified atrial fibrillation: Secondary | ICD-10-CM | POA: Insufficient documentation

## 2022-01-01 DIAGNOSIS — M7989 Other specified soft tissue disorders: Secondary | ICD-10-CM | POA: Diagnosis not present

## 2022-01-01 DIAGNOSIS — I10 Essential (primary) hypertension: Secondary | ICD-10-CM | POA: Insufficient documentation

## 2022-01-01 DIAGNOSIS — R0602 Shortness of breath: Secondary | ICD-10-CM

## 2022-01-01 DIAGNOSIS — Z853 Personal history of malignant neoplasm of breast: Secondary | ICD-10-CM | POA: Diagnosis not present

## 2022-01-01 LAB — BASIC METABOLIC PANEL
Anion gap: 11 (ref 5–15)
BUN: 13 mg/dL (ref 8–23)
CO2: 24 mmol/L (ref 22–32)
Calcium: 9.4 mg/dL (ref 8.9–10.3)
Chloride: 102 mmol/L (ref 98–111)
Creatinine, Ser: 1.08 mg/dL — ABNORMAL HIGH (ref 0.44–1.00)
GFR, Estimated: 51 mL/min — ABNORMAL LOW (ref >60)
Glucose, Bld: 86 mg/dL (ref 70–99)
Potassium: 4.4 mmol/L (ref 3.5–5.1)
Sodium: 137 mmol/L (ref 135–145)

## 2022-01-01 LAB — CBC
HCT: 43 % (ref 36.0–46.0)
Hemoglobin: 14.2 g/dL (ref 12.0–15.0)
MCH: 30.4 pg (ref 26.0–34.0)
MCHC: 33 g/dL (ref 30.0–36.0)
MCV: 92.1 fL (ref 80.0–100.0)
Platelets: 339 10*3/uL (ref 150–400)
RBC: 4.67 MIL/uL (ref 3.87–5.11)
RDW: 12.4 % (ref 11.5–15.5)
WBC: 9.3 10*3/uL (ref 4.0–10.5)
nRBC: 0 % (ref 0.0–0.2)

## 2022-01-01 LAB — TROPONIN I (HIGH SENSITIVITY): Troponin I (High Sensitivity): <2 ng/L (ref <18)

## 2022-01-01 NOTE — Discharge Instructions (Addendum)
I think your leg is most likely musculoskeletal, however, we do not have the tools available here to rule out in the blood clot in your leg.  I am more concerned about your chest pressure/heaviness and shortness of breath.  I think we need more information to make sure that you are safe to go home.  Your EKG was normal here.  I think that you are safe to go to the ED via private vehicle.  Please go immediately to the ER.

## 2022-01-01 NOTE — ED Triage Notes (Signed)
Patient is here for "Right lower leg pain". Felt a knot under skin when rubbing it last night. Hurts to extend/flex leg. A little bit of swelling noticed. No fever.

## 2022-01-01 NOTE — ED Provider Notes (Signed)
HPI  SUBJECTIVE:  Traci Holland is a 86 y.o. female who presents with 2 issues: First, she reports right lateral achy constant crampy calf pain starting yesterday.  She states that she felt a round, mobile knot about the size of a marble along the lateral right calf which has since gotten smaller.  No trauma to the area, bruising, recent immobilization, surgery, hemoptysis.  She is not sure if she has had any calf swelling.  She took 325 mg of aspirin twice without improvement in her symptoms.  Symptoms are worse with foot plantar/dorsiflexion and with crossing legs.  She has never had leg pain like this before.    Second, she also reports 2 episodes of left-sided chest pressure/heaviness accompanied with shortness of breath lasting minutes today.  First episode happened while she was walking around today, it resolved with rest.  She had a second episode here at rest.  It lasted longer than the first episode.  She states that her head "did not feel right", but denies accompanying nausea, diaphoresis, radiation of this pain up her neck, down her arm, through to her back, palpitations,  dizziness.  She states that she has had similar symptoms before, and was found to be in atrial fibrillation.  She has a past medical history of asthma, breast cancer, status post surgery, radiation 2 years ago.  She has a history of a CVA with no residual deficits, atrial fibrillation status post ablation, on Xarelto.  She also has a history of hypercholesterolemia, hypertension, chronic kidney disease stage IIIa.  No history of MI, coronary disease, diabetes.  PMD: In [place]    Past Medical History:  Diagnosis Date   Asthma    Cancer ([place])    breast ca  08/2019   Chronic kidney disease (CKD), stage III (moderate) (HCC)    Complication of anesthesia    hard to get to sleep then slow to wake for breast biopsy.   Dyspnea    Elevated lipids    GERD (gastroesophageal reflux disease)    Headache    Migraines    History of hiatal hernia    Hypertension    Lung nodule    Mitral valve disorder    PAF (paroxysmal atrial fibrillation) ([place])    ablation 2013   Stroke [facility]) 2013   no deficits   Vertigo    last episode 2-3 yrs ago   Wears hearing aid in both ears     Past Surgical History:  Procedure Laterality Date   BREAST BIOPSY Left 04/23/2010   bx /clip-neg   BREAST BIOPSY Right 2010   neg   cardiac ablasion     CATARACT EXTRACTION W/PHACO Left 10/22/2020   Procedure: CATARACT EXTRACTION PHACO AND INTRAOCULAR LENS PLACEMENT (IOC) LEFT 10.03 01:30.4 11.1%;  Surgeon: Leandrew Koyanagi, MD;  Location: [place];  Service: Ophthalmology;  Laterality: Left;   CATARACT EXTRACTION W/PHACO Right 11/13/2020   Procedure: CATARACT EXTRACTION PHACO AND INTRAOCULAR LENS PLACEMENT ([place]) RIGHT;  Surgeon: Leandrew Koyanagi, MD;  Location: [facility];  Service: Ophthalmology;  Laterality: Right;  10.08 1:20.4 13.6%   COLONOSCOPY     COLONOSCOPY WITH PROPOFOL N/A 01/25/2018   Procedure: COLONOSCOPY WITH PROPOFOL;  Surgeon: Lollie Sails, MD;  Location: [facility] ENDOSCOPY;  Service: Endoscopy;  Laterality: N/A;   DIRECT LARYNGOSCOPY Left 02/18/2018   Procedure: DIRECT LARYNGOSCOPY WITH EXCISION LARYNGEAL LESION;  Surgeon: Margaretha Sheffield, MD;  Location: [place];  Service: ENT;  Laterality: Left;   ESOPHAGOGASTRODUODENOSCOPY (EGD) WITH PROPOFOL  N/A 01/25/2018   Procedure: ESOPHAGOGASTRODUODENOSCOPY (EGD) WITH PROPOFOL;  Surgeon: Lollie Sails, MD;  Location: [facility] ENDOSCOPY;  Service: Endoscopy;  Laterality: N/A;   HERNIA REPAIR     pelvic surgery x2 for post-partum hemmoghage N/A    TONSILLECTOMY     TUBAL LIGATION      Family History  Problem Relation Age of Onset   Breast cancer Daughter 20    Social History   Tobacco Use   Smoking status: Never   Smokeless tobacco: Never  Vaping Use   Vaping Use: Never used  Substance Use Topics   Alcohol use: No   Drug use: No     No current facility-administered medications for this encounter.  Current Outpatient Medications:    Cholecalciferol 50 MCG (2000 UT) CAPS, Take 1,000 Units by mouth daily with supper. , Disp: , Rfl:    Erenumab-aooe (AIMOVIG) 140 MG/ML SOAJ, Inject 140 mg into the skin every 28 (twenty-eight) days. , Disp: , Rfl:    ezetimibe (ZETIA) 10 MG tablet, Take 10 mg by mouth daily with supper. , Disp: , Rfl:    famotidine (PEPCID) 20 MG tablet, Take 20 mg by mouth daily with supper., Disp: , Rfl:    fexofenadine (ALLEGRA) 60 MG tablet, Take 60 mg by mouth daily., Disp: , Rfl:    fluticasone (VERAMYST) 27.5 MCG/SPRAY nasal spray, Place 1-2 sprays into the nose 2 (two) times daily as needed for rhinitis., Disp: , Rfl:    furosemide (LASIX) 20 MG tablet, Take 20 mg by mouth daily as needed (fluid retention). , Disp: , Rfl:    losartan (COZAAR) 100 MG tablet, Take 100 mg by mouth daily with supper. , Disp: , Rfl:    meclizine (ANTIVERT) 25 MG tablet, Take 1 tablet (25 mg total) by mouth 3 (three) times daily as needed for dizziness. (Patient taking differently: Take 12.5-25 mg by mouth every 6 (six) hours as needed (vertigo).), Disp: 30 tablet, Rfl: 0   metoprolol succinate (TOPROL-XL) 100 MG 24 hr tablet, Take 100 mg by mouth daily. 100 mg + 50 mg=150 mg at supper Take with or immediately following a meal., Disp: , Rfl:    metoprolol succinate (TOPROL-XL) 50 MG 24 hr tablet, Take 50 mg by mouth daily with supper. 100 mg + 50 mg=150 mg at supperTake with or immediately following a meal., Disp: , Rfl:    NURTEC 75 MG TBDP, Take 75 mg by mouth every 2 (two) hours as needed for migraine., Disp: , Rfl:    ondansetron (ZOFRAN) 4 MG tablet, Take 4 mg by mouth every 8 (eight) hours as needed for nausea or vomiting., Disp: , Rfl:    rivaroxaban (XARELTO) 20 MG TABS tablet, Take 20 mg by mouth daily with supper., Disp: , Rfl:    rosuvastatin (CRESTOR) 20 MG tablet, Take 20 mg by mouth daily with supper. , Disp: ,  Rfl:    sertraline (ZOLOFT) 25 MG tablet, Take 25 mg by mouth daily., Disp: , Rfl:    albuterol (VENTOLIN HFA) 108 (90 Base) MCG/ACT inhaler, Inhale into the lungs every 6 (six) hours as needed for wheezing or shortness of breath., Disp: , Rfl:    cyclobenzaprine (FLEXERIL) 10 MG tablet, Take 10 mg by mouth 3 (three) times daily as needed for muscle spasms., Disp: , Rfl:    PREDNISOLON-MOXIFLOX-BROMFENAC OP, Place 1 drop into the left eye daily., Disp: , Rfl:    promethazine (PHENERGAN) 25 MG tablet, Take 25 mg by mouth every 6 (  six) hours as needed for nausea or vomiting., Disp: , Rfl:   Allergies  Allergen Reactions   Colcrys [Colchicine] Diarrhea   Lipitor [Atorvastatin]     Joint pain   Other Rash    Chlorhexidine site prep for PIV placement   Propafenone Palpitations     ROS  As noted in HPI.   Physical Exam  BP 136/66 (BP Location: Right Arm)    Pulse 68    Temp 97.7 F (36.5 C) (Oral)    Resp 20    Wt 77.1 kg    LMP  (LMP Unknown)    SpO2 100%    BMI 27.43 kg/m   Constitutional: Well developed, well nourished, no acute distress Eyes: PERRL, EOMI, conjunctiva normal bilaterally HENT: Normocephalic, atraumatic,mucus membranes moist Respiratory: Clear to auscultation bilaterally, no rales, no wheezing, no rhonchi Cardiovascular: Normal rate and rhythm, no murmurs, no gallops, no rubs GI: Nondistended skin: No rash, skin intact Musculoskeletal: Right calf 36.5 cm, left calf 36 cm.  No edema bilaterally.  No bruising, discoloration, erythema.  No palpable cord or mass.  Tenderness in the lateral right calf.  No popliteal, midline calf tenderness. Neurologic: Alert & oriented x 3, CN III-XII grossly intact, no motor deficits, sensation grossly intact Psychiatric: Speech and behavior appropriate   ED Course   Medications - No data to display  Orders Placed This Encounter  Procedures   ED EKG    Standing Status:   Standing    Number of Occurrences:   1    Order  Specific Question:   Reason for Exam    Answer:   Shortness of breath   No results found for this or any previous visit (from the past 24 hour(s)). No results found.  ED Clinical Impression  1. Chest pain, unspecified type   2. Shortness of breath   3. Right calf pain      ED Assessment/Plan  Suspect that the calf pain is musculoskeletal, she is already on Xarelto, which makes DVT less likely.  More concern for 2 episodes of chest pressure/heaviness with shortness of breath occurring today.  She had one with exertion earlier today, and then another, similar 1, occurring at rest which lasted longer than the first episode.  She has multiple medical comorbidities, concern for cardiac cause.  Checking EKG.   HEART score:  History: +1 moderately suspicious EKG: +0 normal Age: +65 age above 26 Risk factors: +2 for more than 3 cardiac risk factors Troponin: n/a  Total heart score: 5-moderate risk   EKG: Normal sinus rhythm, rate 63, normal axis, normal intervals.  No hypertrophy.  No ST-T wave changes.  No change compared EKG from 2019.  Patient asymptomatic while EKG was obtained  EKG unremarkable.  No evidence of ischemia.  Feel the patient is stable for transfer to the emergency department via private vehicle.  Discussed rationale for transfer to the ED with patient and family member.  They agree to go.  No orders of the defined types were placed in this encounter.     *This clinic note was created using Dragon dictation software. Therefore, there may be occasional mistakes despite careful proofreading. ?    Melynda Ripple, MD 01/01/22 2014

## 2022-01-01 NOTE — ED Triage Notes (Signed)
Pt was sent from [facility] urgent care for eval of chest pain and sob.  Pt also has left leg pain.  Sx began yesterday.  No known injury to leg.  Pt alert  speech clear.

## 2022-01-01 NOTE — ED Triage Notes (Signed)
SOB episode during intake.

## 2022-01-02 ENCOUNTER — Emergency Department
Admission: EM | Admit: 2022-01-02 | Discharge: 2022-01-02 | Disposition: A | Discharge status: Home / Self Care | Payer: Medicare Other | Attending: Emergency Medicine | Admitting: Emergency Medicine

## 2022-01-02 ENCOUNTER — Emergency Department: Payer: Medicare Other

## 2022-01-02 DIAGNOSIS — R0789 Other chest pain: Secondary | ICD-10-CM

## 2022-01-02 DIAGNOSIS — M79604 Pain in right leg: Secondary | ICD-10-CM

## 2022-01-02 LAB — TROPONIN I (HIGH SENSITIVITY): Troponin I (High Sensitivity): 8 ng/L (ref <18)

## 2022-01-02 NOTE — Discharge Instructions (Signed)
Your workup in the Emergency Department today was reassuring.  We did not find any specific abnormalities.  We recommend you drink plenty of fluids, take your regular medications and/or any new ones prescribed today, and follow up with the doctor(s) listed in these documents as recommended.  Return to the Emergency Department if you develop new or worsening symptoms that concern you.  

## 2022-01-02 NOTE — ED Provider Notes (Signed)
[facility] Provider Note    Event Date/Time   First MD Initiated Contact with Patient 01/02/22 0147     (approximate)   History   Chest Pain   HPI  Traci Holland is a 86 y.o. female with history that includes a prior stroke for which she takes Xarelto and has no residual deficits, history of atrial fibrillation status post ablation, history of breast cancer status postsurgery and radiation, hypertension, hypercholesterolemia.  She sees Dr. Saralyn Pilar for cardiology.  She presents tonight for several episodes of acute onset shortness of breath and some chest pressure.  This happened while she was out and about, shopping with her daughter.  He comes in the setting of noticing that she has a swollen and slightly tender area on the outer part of her right lower leg.  She and her daughter could feel a lump and she was concerned she might have a blood clot.  She has been compliant with all of her medications.  Nothing in particular seem to make the symptoms better or worse.  She is free of any pain or discomfort at this time and is not feeling short of breath.  Denies recent fever.  No recent trauma of which she is aware.     Physical Exam   Triage Vital Signs: ED Triage Vitals  Enc Vitals Group     BP 01/01/22 2120 (!) 170/72     Pulse Rate 01/01/22 2120 69     Resp 01/01/22 2120 20     Temp 01/01/22 2120 98.8 F (37.1 C)     Temp Source 01/01/22 2120 Oral     SpO2 01/01/22 2120 98 %     Weight 01/01/22 2116 77.1 kg (170 lb)     Height 01/01/22 2116 1.676 m (5\' 6" )     Head Circumference --      Peak Flow --      Pain Score 01/01/22 2116 3     Pain Loc --      Pain Edu? --      Excl. in [place]? --     Most recent vital signs: Vitals:   01/02/22 0330 01/02/22 0500  BP: (!) 172/71 (!) 159/65  Pulse: 64 (!) 58  Resp: 14 15  Temp:    SpO2: 97% 97%     General: Awake, no distress.  CV:  Good peripheral perfusion.  Resp:  Normal effort.  Abd:  No  distention.  Other:  No palpable swelling or deformities or areas of induration along the popliteal fossa nor along the deep vein distribution.  She has a small area of induration along the right lateral aspect of her mid calf, but it is not particularly tender and there is no sign of infection or ecchymosis.   ED Results / Procedures / Treatments   Labs (all labs ordered are listed, but only abnormal results are displayed) Labs Reviewed  BASIC METABOLIC PANEL - Abnormal; Notable for the following components:      Result Value   Creatinine, Ser 1.08 (*)    GFR, Estimated 51 (*)    All other components within normal limits  CBC  TROPONIN I (HIGH SENSITIVITY)  TROPONIN I (HIGH SENSITIVITY)     EKG  ED ECG REPORT I, Hinda Kehr, the attending physician, personally viewed and interpreted this ECG.  Date: 01/01/2022 EKG Time: 21: 30 Rate: 68 Rhythm: normal sinus rhythm QRS Axis: normal Intervals: normal ST/T Wave abnormalities: No significant ST segment or T  wave changes. Narrative Interpretation: no evidence of acute ischemia    RADIOLOGY I personally reviewed the patient's 2 view chest x-ray and there is no evidence of acute abnormality.    PROCEDURES:  Critical Care performed: No  .1-3 Lead EKG Interpretation Performed by: Hinda Kehr, MD Authorized by: Hinda Kehr, MD     Interpretation: normal     ECG rate:  60   ECG rate assessment: normal     Rhythm: sinus rhythm     Ectopy: none     Conduction: normal     MEDICATIONS ORDERED IN ED: Medications - No data to display   IMPRESSION / MDM / [place] / ED COURSE  I reviewed the triage vital signs and the nursing notes.                              Differential diagnosis includes, but is not limited to, intermittent atrial fibrillation, angina, ACS including unstable angina, viral infection, DVT, PE.   The patient is on the cardiac monitor to evaluate for evidence of arrhythmia and/or  significant heart rate changes.  No sign of ischemia on EKG which I personally reviewed.  Vital signs are stable and within normal limits.  I personally reviewed the patient's chest x-ray and I agree that there is no evidence of acute abnormality such as pneumonia.  Labs ordered as part of her work-up include basic metabolic panel, high-sensitivity troponin x2, and CBC.  Fortunately they are all essentially within normal limits other than a very minor elevation of her creatinine which is likely not an contributory.  Her high-sensitivity troponin remains within normal limits although there was a slight increase between the original and the redraw but the redraw was still only at 8.  Had extensive conversation with the patient and her daughter who is at bedside.  I also reviewed the clinic note from when the patient went to the urgent care before coming to the emergency department, and I understand the physician's concerns that she required additional evaluation to rule out ACS.  She does have risk factors for ACS such as hypertension and hyperlipidemia but she has no known history of coronary artery disease nor ACS.  She is followed by Dr. Saralyn Pilar with cardiology.  I considered admitting the patient for further chest pain observation, but I discussed with the patient and the daughter, and given that she is at low risk for having an acute event at this time and she has had 2 high-sensitivity troponins within normal limits, she is comfortable with the plan for discharge and close outpatient follow-up.  However she remains concerned about the spot on her leg.  I explained that it is not in a distribution I would associate with DVT but she and her family would feel better if it was evaluated.  We will proceed with an ultrasound, but at this point I believe this is likely a secondary and nonemergent finding.  There is no evidence she has an infection.  I believe she will be appropriate for outpatient  follow-up.  Clinical Course as of 01/02/22 0536  Fri Jan 02, 2022  0414 US Venous Img Lower Unilateral Right I personally reviewed the patient's ultrasound images and see no obvious acute abnormality.  The ultrasound report by the radiologist mentions a Baker's cyst as well as a nonspecific fluid collection at the area of concern on the patient's right lateral lower leg which the radiologist said  could be either hematoma or an abscess.  Given that there is no clinical correlation with abscess, I believe it is likely she had a small hematoma, particular given that she is on Xarelto, and that it is already improving and not actively bleeding.  I have dated the patient and her daughter about these results and they understand and agree that this is most likely hematoma.  They are comfortable with the plan for discharge and outpatient follow-up with Dr. Saralyn Pilar with whom she is already established for cardiology.  I gave my usual and customary return precautions.  I considered admitting the patient for her chest pressure and shortness of breath and we (the patient, her daughter, and me ) discussed this in detail, but she would prefer to follow-up and I agree that there is very low risk of acute coronary event in the immediate future, but I reiterated my strict return precautions. [CF]    Clinical Course User Index [CF] Hinda Kehr, MD     FINAL CLINICAL IMPRESSION(S) / ED DIAGNOSES   Final diagnoses:  Chest pressure  Right leg pain     Rx / DC Orders   ED Discharge Orders     None        Note:  This document was prepared using Dragon voice recognition software and may include unintentional dictation errors.   Hinda Kehr, MD 01/02/22 [phone]

## 2022-04-11 ENCOUNTER — Other Ambulatory Visit: Payer: Self-pay

## 2022-04-11 ENCOUNTER — Emergency Department: Payer: Medicare Other

## 2022-04-11 ENCOUNTER — Emergency Department
Admission: EM | Admit: 2022-04-11 | Discharge: 2022-04-11 | Disposition: A | Discharge status: Home / Self Care | Payer: Medicare Other | Attending: Emergency Medicine | Admitting: Emergency Medicine

## 2022-04-11 DIAGNOSIS — Z7901 Long term (current) use of anticoagulants: Secondary | ICD-10-CM | POA: Diagnosis not present

## 2022-04-11 DIAGNOSIS — N183 Chronic kidney disease, stage 3 unspecified: Secondary | ICD-10-CM | POA: Diagnosis not present

## 2022-04-11 DIAGNOSIS — R197 Diarrhea, unspecified: Secondary | ICD-10-CM | POA: Diagnosis not present

## 2022-04-11 DIAGNOSIS — R112 Nausea with vomiting, unspecified: Secondary | ICD-10-CM | POA: Diagnosis present

## 2022-04-11 DIAGNOSIS — R079 Chest pain, unspecified: Secondary | ICD-10-CM | POA: Diagnosis not present

## 2022-04-11 DIAGNOSIS — I129 Hypertensive chronic kidney disease with stage 1 through stage 4 chronic kidney disease, or unspecified chronic kidney disease: Secondary | ICD-10-CM | POA: Diagnosis not present

## 2022-04-11 DIAGNOSIS — Z853 Personal history of malignant neoplasm of breast: Secondary | ICD-10-CM | POA: Insufficient documentation

## 2022-04-11 DIAGNOSIS — R109 Unspecified abdominal pain: Secondary | ICD-10-CM | POA: Diagnosis not present

## 2022-04-11 DIAGNOSIS — J45909 Unspecified asthma, uncomplicated: Secondary | ICD-10-CM | POA: Insufficient documentation

## 2022-04-11 DIAGNOSIS — Z79899 Other long term (current) drug therapy: Secondary | ICD-10-CM | POA: Insufficient documentation

## 2022-04-11 LAB — URINALYSIS, ROUTINE W REFLEX MICROSCOPIC
Bilirubin Urine: NEGATIVE
Glucose, UA: NEGATIVE mg/dL
Hgb urine dipstick: NEGATIVE
Ketones, ur: NEGATIVE mg/dL
Leukocytes,Ua: NEGATIVE
Nitrite: NEGATIVE
Protein, ur: NEGATIVE mg/dL
Specific Gravity, Urine: >1.046 — ABNORMAL HIGH (ref 1.005–1.030)
pH: 5 (ref 5.0–8.0)

## 2022-04-11 LAB — CBC
HCT: 48 % — ABNORMAL HIGH (ref 36.0–46.0)
Hemoglobin: 15 g/dL (ref 12.0–15.0)
MCH: 29.2 pg (ref 26.0–34.0)
MCHC: 31.3 g/dL (ref 30.0–36.0)
MCV: 93.6 fL (ref 80.0–100.0)
Platelets: 388 10*3/uL (ref 150–400)
RBC: 5.13 MIL/uL — ABNORMAL HIGH (ref 3.87–5.11)
RDW: 13.1 % (ref 11.5–15.5)
WBC: 14.4 10*3/uL — ABNORMAL HIGH (ref 4.0–10.5)
nRBC: 0 % (ref 0.0–0.2)

## 2022-04-11 LAB — COMPREHENSIVE METABOLIC PANEL WITH GFR
ALT: 16 U/L (ref 0–44)
AST: 24 U/L (ref 15–41)
Albumin: 4.7 g/dL (ref 3.5–5.0)
Alkaline Phosphatase: 65 U/L (ref 38–126)
Anion gap: 7 (ref 5–15)
BUN: 16 mg/dL (ref 8–23)
CO2: 27 mmol/L (ref 22–32)
Calcium: 9.2 mg/dL (ref 8.9–10.3)
Chloride: 104 mmol/L (ref 98–111)
Creatinine, Ser: 0.97 mg/dL (ref 0.44–1.00)
GFR, Estimated: 58 mL/min — ABNORMAL LOW
Glucose, Bld: 118 mg/dL — ABNORMAL HIGH (ref 70–99)
Potassium: 4 mmol/L (ref 3.5–5.1)
Sodium: 138 mmol/L (ref 135–145)
Total Bilirubin: 0.7 mg/dL (ref 0.3–1.2)
Total Protein: 8 g/dL (ref 6.5–8.1)

## 2022-04-11 LAB — TROPONIN I (HIGH SENSITIVITY): Troponin I (High Sensitivity): 5 ng/L

## 2022-04-11 LAB — LIPASE, BLOOD: Lipase: 32 U/L (ref 11–51)

## 2022-04-11 MED ORDER — ONDANSETRON HCL 4 MG/2ML IJ SOLN
4.0000 mg | Freq: Once | INTRAMUSCULAR | Status: AC
  Administered 2022-04-11: 4 mg via INTRAVENOUS
  Filled 2022-04-11: qty 2

## 2022-04-11 MED ORDER — IOHEXOL 300 MG/ML  SOLN
100.0000 mL | Freq: Once | INTRAMUSCULAR | Status: AC | PRN
  Administered 2022-04-11: 100 mL via INTRAVENOUS

## 2022-04-11 MED ORDER — SODIUM CHLORIDE 0.9 % IV BOLUS
1000.0000 mL | Freq: Once | INTRAVENOUS | Status: AC
  Administered 2022-04-11: 1000 mL via INTRAVENOUS

## 2022-04-11 MED ORDER — AZITHROMYCIN 250 MG PO TABS: ORAL_TABLET | ORAL | 0 refills | Status: AC

## 2022-04-11 MED ORDER — ONDANSETRON 4 MG PO TBDP: 4.0000 mg | ORAL_TABLET | Freq: Three times a day (TID) | ORAL | 0 refills | Status: AC | PRN

## 2022-04-11 NOTE — ED Notes (Signed)
Patient transported to CT 

## 2022-04-11 NOTE — ED Provider Notes (Signed)
Patient care assumed from Dr. Beather Arbour.  Patient's urinalysis has resulted showing no concerning findings.  CT scan showed no acute findings in the abdomen/pelvis but does show irregular airspace disease in the left base.  Patient states she has been diagnosed with breast cancer previously and has no nodules in the lungs but does not know where these nodules are.  Chest x-ray read as negative.  Given the CT findings I believe it would be prudent to cover with a short course of antibiotics.  Have the patient follow-up with her doctor.  We will also prescribe a nausea medication for the patient. ?  ?Harvest Dark, MD ?04/11/22 1141 ? ?

## 2022-04-11 NOTE — ED Notes (Signed)
Pt and son give verbal consent to dc  ? ?

## 2022-04-11 NOTE — ED Notes (Signed)
Patient transported to X-ray 

## 2022-04-11 NOTE — ED Triage Notes (Signed)
Pt presents to ER via ems from home c/o n/v/d that started yesterday night.  Pt states she has been going to the bathroom non-stop and has started having trouble getting up to go to the bathroom.  Denies abd pain at this time.  Pt states she lives at home alone and called son who called ems for her.  Pt is noted to be very hard of hearing.  Pt is A&O x4 at this time in NAD in triage.   ?

## 2022-04-11 NOTE — ED Provider Notes (Signed)
? ?[facility] ?Provider Note ? ? ? Event Date/Time  ? First MD Initiated Contact with Patient 04/11/22 [phone]   ?  (approximate) ? ? ?History  ? ?Emesis and Diarrhea ? ? ?HPI ? ?Traci Holland is a 86 y.o. female brought to the ED via EMS from home with a chief complaint of nausea, vomiting and diarrhea which began yesterday evening.  Associated with abdominal cramping.  States this week she has not been feeling well with occasional chest pain.  Denies fever, cough,  shortness of breath, dysuria. ?  ? ? ?Past Medical History  ? ?Past Medical History:  ?Diagnosis Date  ? Asthma   ? Cancer [facility])   ? breast ca  08/2019  ? Chronic kidney disease (CKD), stage III (moderate) (HCC)   ? Complication of anesthesia   ? hard to get to sleep then slow to wake for breast biopsy.  ? Dyspnea   ? Elevated lipids   ? GERD (gastroesophageal reflux disease)   ? Headache   ? Migraines  ? History of hiatal hernia   ? Hypertension   ? Lung nodule   ? Mitral valve disorder   ? PAF (paroxysmal atrial fibrillation) ([place])   ? ablation 2013  ? Stroke [facility]) 2013  ? no deficits  ? Vertigo   ? last episode 2-3 yrs ago  ? Wears hearing aid in both ears   ? ? ? ?Active Problem List  ? ?Patient Active Problem List  ? Diagnosis Date Noted  ? Ataxia 08/09/2018  ? ? ? ?Past Surgical History  ? ?Past Surgical History:  ?Procedure Laterality Date  ? BREAST BIOPSY Left 04/23/2010  ? bx /clip-neg  ? BREAST BIOPSY Right 2010  ? neg  ? cardiac ablasion    ? CATARACT EXTRACTION W/PHACO Left 10/22/2020  ? Procedure: CATARACT EXTRACTION PHACO AND INTRAOCULAR LENS PLACEMENT ([facility]) LEFT 10.03 01:30.4 11.1%;  Surgeon: Leandrew Koyanagi, MD;  Location: [place];  Service: Ophthalmology;  Laterality: Left;  ? CATARACT EXTRACTION W/PHACO Right 11/13/2020  ? Procedure: CATARACT EXTRACTION PHACO AND INTRAOCULAR LENS PLACEMENT ([place]) RIGHT;  Surgeon: Leandrew Koyanagi, MD;  Location: [facility] ORS;  Service: Ophthalmology;  Laterality: Right;   10.08 ?1:20.4 ?13.6%  ? COLONOSCOPY    ? COLONOSCOPY WITH PROPOFOL N/A 01/25/2018  ? Procedure: COLONOSCOPY WITH PROPOFOL;  Surgeon: Lollie Sails, MD;  Location: [facility] ENDOSCOPY;  Service: Endoscopy;  Laterality: N/A;  ? DIRECT LARYNGOSCOPY Left 02/18/2018  ? Procedure: DIRECT LARYNGOSCOPY WITH EXCISION LARYNGEAL LESION;  Surgeon: Margaretha Sheffield, MD;  Location: [place];  Service: ENT;  Laterality: Left;  ? ESOPHAGOGASTRODUODENOSCOPY (EGD) WITH PROPOFOL N/A 01/25/2018  ? Procedure: ESOPHAGOGASTRODUODENOSCOPY (EGD) WITH PROPOFOL;  Surgeon: Lollie Sails, MD;  Location: [facility] ENDOSCOPY;  Service: Endoscopy;  Laterality: N/A;  ? HERNIA REPAIR    ? pelvic surgery x2 for post-partum hemmoghage N/A   ? TONSILLECTOMY    ? TUBAL LIGATION    ? ? ? ?Home Medications  ? ?Prior to Admission medications   ?Medication Sig Start Date End Date Taking? Authorizing Provider  ?albuterol (VENTOLIN HFA) 108 (90 Base) MCG/ACT inhaler Inhale into the lungs every 6 (six) hours as needed for wheezing or shortness of breath.    [provider]  ?Cholecalciferol 50 MCG (2000 UT) CAPS Take 1,000 Units by mouth daily with supper.     [provider]  ?cyclobenzaprine (FLEXERIL) 10 MG tablet Take 10 mg by mouth 3 (three) times daily as needed for muscle  spasms.    [provider]  ?Erenumab-aooe (AIMOVIG) 140 MG/ML SOAJ Inject 140 mg into the skin every 28 (twenty-eight) days.     [provider]  ?ezetimibe (ZETIA) 10 MG tablet Take 10 mg by mouth daily with supper.     [provider]  ?famotidine (PEPCID) 20 MG tablet Take 20 mg by mouth daily with supper.    [provider]  ?fexofenadine (ALLEGRA) 60 MG tablet Take 60 mg by mouth daily.    [provider]  ?fluticasone (VERAMYST) 27.5 MCG/SPRAY nasal spray Place 1-2 sprays into the nose 2 (two) times daily as needed for rhinitis.    [provider]  ?furosemide (LASIX) 20 MG tablet Take 20 mg by mouth  daily as needed (fluid retention).     [provider]  ?losartan (COZAAR) 100 MG tablet Take 100 mg by mouth daily with supper.     [provider]  ?meclizine (ANTIVERT) 25 MG tablet Take 1 tablet (25 mg total) by mouth 3 (three) times daily as needed for dizziness. ?Patient taking differently: Take 12.5-25 mg by mouth every 6 (six) hours as needed (vertigo). 07/28/18   Coral Spikes, DO  ?metoprolol succinate (TOPROL-XL) 100 MG 24 hr tablet Take 100 mg by mouth daily. 100 mg + 50 mg=150 mg at supper Take with or immediately following a meal.    [provider]  ?metoprolol succinate (TOPROL-XL) 50 MG 24 hr tablet Take 50 mg by mouth daily with supper. 100 mg + 50 mg=150 mg at supperTake with or immediately following a meal.    [provider]  ?NURTEC 75 MG TBDP Take 75 mg by mouth every 2 (two) hours as needed for migraine. 10/22/20   [provider]  ?ondansetron (ZOFRAN) 4 MG tablet Take 4 mg by mouth every 8 (eight) hours as needed for nausea or vomiting.    [provider]  ?PREDNISOLON-MOXIFLOX-BROMFENAC OP Place 1 drop into the left eye daily.    [provider]  ?promethazine (PHENERGAN) 25 MG tablet Take 25 mg by mouth every 6 (six) hours as needed for nausea or vomiting.    [provider]  ?rivaroxaban (XARELTO) 20 MG TABS tablet Take 20 mg by mouth daily with supper.    [provider]  ?rosuvastatin (CRESTOR) 20 MG tablet Take 20 mg by mouth daily with supper.     [provider]  ?sertraline (ZOLOFT) 25 MG tablet Take 25 mg by mouth daily.    [provider]  ? ? ? ?Allergies  ?Colcrys [colchicine], Lipitor [atorvastatin], Other, and Propafenone ? ? ?Family History  ? ?Family History  ?Problem Relation Age of Onset  ? Breast cancer Daughter 58  ? ? ? ?Physical Exam  ?Triage Vital Signs: ?ED Triage Vitals [04/11/22 0403]  ?Enc Vitals Group  ?   BP 136/66  ?   Pulse Rate 68  ?   Resp 19  ?   Temp 97.9 ?F  (36.6 ?C)  ?   Temp Source Oral  ?   SpO2 100 %  ?   Weight 170 lb (77.1 kg)  ?   Height '5\' 6"'$  (1.676 m)  ?   Head Circumference   ?   Peak Flow   ?   Pain Score 0  ?   Pain Loc   ?   Pain Edu?   ?   Excl. in [place]?   ? ? ?Updated Vital Signs: ?BP (!) 142/78  Pulse 72   Temp 97.9 ?F (36.6 ?C) (Oral)   Resp 19   Ht '5\' 6"'$  (1.676 m)   Wt 77.1 kg   LMP  (LMP Unknown)   SpO2 100%   BMI 27.44 kg/m?  ? ? ?General: Awake, no distress.  ?CV:  RRR.  Good peripheral perfusion.  ?Resp:  Normal effort.  CTA B. ?Abd:  Nontender to light or deep palpation.  No distention.  ?Other:  Mildly dry mucous membranes. ? ? ?ED Results / Procedures / Treatments  ?Labs ?(all labs ordered are listed, but only abnormal results are displayed) ?Labs Reviewed  ?COMPREHENSIVE METABOLIC PANEL - Abnormal; Notable for the following components:  ?    Result Value  ? Glucose, Bld 118 (*)   ? GFR, Estimated 58 (*)   ? All other components within normal limits  ?CBC - Abnormal; Notable for the following components:  ? WBC 14.4 (*)   ? RBC 5.13 (*)   ? HCT 48.0 (*)   ? All other components within normal limits  ?C DIFFICILE QUICK SCREEN W PCR REFLEX    ?GASTROINTESTINAL PANEL BY PCR, STOOL (REPLACES STOOL CULTURE)  ?LIPASE, BLOOD  ?URINALYSIS, ROUTINE W REFLEX MICROSCOPIC  ?TROPONIN I (HIGH SENSITIVITY)  ? ? ? ?EKG ? ?ED ECG REPORT ?I, Paulette Blanch, the attending physician, personally viewed and interpreted this ECG. ? ? Date: 04/11/2022 ? EKG Time: 0646 ? Rate: 74 ? Rhythm: normal sinus rhythm ? Axis: Normal ? Intervals:none ? ST&T Change: Nonspecific ? ? ? ?RADIOLOGY ?I have independently visualized and interpreted patient's CT abdomen pelvis as well as noted the radiology interpretation: ? ?CT abdomen pelvis: No acute intra-abdominal abnormality, question left lung base airspace disease ? ? ?Official radiology report(s): ?CT Abdomen Pelvis W Contrast ? ?Result Date: 04/11/2022 ?CLINICAL DATA:  Nausea and vomiting. EXAM: CT ABDOMEN AND PELVIS WITH  CONTRAST TECHNIQUE: Multidetector CT imaging of the abdomen and pelvis was performed using the standard protocol following bolus administration of intravenous contrast. RADIATION DOSE REDUCTION: This exam was perf

## 2022-07-16 ENCOUNTER — Other Ambulatory Visit: Payer: Self-pay

## 2022-07-16 ENCOUNTER — Emergency Department: Payer: Medicare Other

## 2022-07-16 ENCOUNTER — Ambulatory Visit (INDEPENDENT_AMBULATORY_CARE_PROVIDER_SITE_OTHER)
Admission: EM | Admit: 2022-07-16 | Discharge: 2022-07-16 | Disposition: A | Discharge status: Other Acute Inpatient Hospital | Payer: Medicare Other | Source: Home / Self Care | Attending: Family Medicine | Admitting: Family Medicine

## 2022-07-16 ENCOUNTER — Inpatient Hospital Stay
Admission: EM | Admit: 2022-07-16 | Discharge: 2022-07-18 | DRG: 392 | Disposition: A | Discharge status: Home / Self Care | Payer: Medicare Other | Attending: Internal Medicine | Admitting: Internal Medicine

## 2022-07-16 ENCOUNTER — Encounter: Payer: Self-pay | Admitting: Emergency Medicine

## 2022-07-16 ENCOUNTER — Encounter: Payer: Self-pay | Admitting: Intensive Care

## 2022-07-16 ENCOUNTER — Ambulatory Visit: Payer: Medicare Other

## 2022-07-16 DIAGNOSIS — Z79899 Other long term (current) drug therapy: Secondary | ICD-10-CM

## 2022-07-16 DIAGNOSIS — R079 Chest pain, unspecified: Secondary | ICD-10-CM

## 2022-07-16 DIAGNOSIS — N1832 Chronic kidney disease, stage 3b: Secondary | ICD-10-CM | POA: Diagnosis present

## 2022-07-16 DIAGNOSIS — Z86010 Personal history of colonic polyps: Secondary | ICD-10-CM | POA: Diagnosis not present

## 2022-07-16 DIAGNOSIS — R0789 Other chest pain: Secondary | ICD-10-CM | POA: Diagnosis present

## 2022-07-16 DIAGNOSIS — J45909 Unspecified asthma, uncomplicated: Secondary | ICD-10-CM | POA: Diagnosis present

## 2022-07-16 DIAGNOSIS — K529 Noninfective gastroenteritis and colitis, unspecified: Secondary | ICD-10-CM | POA: Diagnosis present

## 2022-07-16 DIAGNOSIS — G43709 Chronic migraine without aura, not intractable, without status migrainosus: Secondary | ICD-10-CM | POA: Diagnosis present

## 2022-07-16 DIAGNOSIS — I4891 Unspecified atrial fibrillation: Secondary | ICD-10-CM | POA: Insufficient documentation

## 2022-07-16 DIAGNOSIS — Z7901 Long term (current) use of anticoagulants: Secondary | ICD-10-CM

## 2022-07-16 DIAGNOSIS — Z974 Presence of external hearing-aid: Secondary | ICD-10-CM

## 2022-07-16 DIAGNOSIS — I480 Paroxysmal atrial fibrillation: Secondary | ICD-10-CM | POA: Diagnosis present

## 2022-07-16 DIAGNOSIS — R002 Palpitations: Secondary | ICD-10-CM | POA: Diagnosis present

## 2022-07-16 DIAGNOSIS — I38 Endocarditis, valve unspecified: Secondary | ICD-10-CM

## 2022-07-16 DIAGNOSIS — Z888 Allergy status to other drugs, medicaments and biological substances status: Secondary | ICD-10-CM | POA: Diagnosis not present

## 2022-07-16 DIAGNOSIS — R42 Dizziness and giddiness: Secondary | ICD-10-CM | POA: Diagnosis present

## 2022-07-16 DIAGNOSIS — Z803 Family history of malignant neoplasm of breast: Secondary | ICD-10-CM | POA: Diagnosis not present

## 2022-07-16 DIAGNOSIS — Z853 Personal history of malignant neoplasm of breast: Secondary | ICD-10-CM | POA: Diagnosis not present

## 2022-07-16 DIAGNOSIS — K760 Fatty (change of) liver, not elsewhere classified: Secondary | ICD-10-CM | POA: Diagnosis present

## 2022-07-16 DIAGNOSIS — I129 Hypertensive chronic kidney disease with stage 1 through stage 4 chronic kidney disease, or unspecified chronic kidney disease: Secondary | ICD-10-CM | POA: Diagnosis present

## 2022-07-16 DIAGNOSIS — K5730 Diverticulosis of large intestine without perforation or abscess without bleeding: Secondary | ICD-10-CM | POA: Diagnosis present

## 2022-07-16 DIAGNOSIS — Z8673 Personal history of transient ischemic attack (TIA), and cerebral infarction without residual deficits: Secondary | ICD-10-CM

## 2022-07-16 DIAGNOSIS — K449 Diaphragmatic hernia without obstruction or gangrene: Secondary | ICD-10-CM | POA: Diagnosis present

## 2022-07-16 HISTORY — DX: Unspecified atrial fibrillation: I48.91

## 2022-07-16 LAB — CBC WITH DIFFERENTIAL/PLATELET
Abs Immature Granulocytes: 0.01 10*3/uL (ref 0.00–0.07)
Basophils Absolute: 0 10*3/uL (ref 0.0–0.1)
Basophils Relative: 1 %
Eosinophils Absolute: 0.1 10*3/uL (ref 0.0–0.5)
Eosinophils Relative: 1 %
HCT: 44.9 % (ref 36.0–46.0)
Hemoglobin: 14.9 g/dL (ref 12.0–15.0)
Immature Granulocytes: 0 %
Lymphocytes Relative: 32 %
Lymphs Abs: 1.8 10*3/uL (ref 0.7–4.0)
MCH: 30.5 pg (ref 26.0–34.0)
MCHC: 33.2 g/dL (ref 30.0–36.0)
MCV: 91.8 fL (ref 80.0–100.0)
Monocytes Absolute: 0.6 10*3/uL (ref 0.1–1.0)
Monocytes Relative: 11 %
Neutro Abs: 3.1 10*3/uL (ref 1.7–7.7)
Neutrophils Relative %: 55 %
Platelets: 338 10*3/uL (ref 150–400)
RBC: 4.89 MIL/uL (ref 3.87–5.11)
RDW: 13.1 % (ref 11.5–15.5)
WBC: 5.6 10*3/uL (ref 4.0–10.5)
nRBC: 0 % (ref 0.0–0.2)

## 2022-07-16 LAB — BASIC METABOLIC PANEL
Anion gap: 12 (ref 5–15)
BUN: 11 mg/dL (ref 8–23)
CO2: 23 mmol/L (ref 22–32)
Calcium: 9.4 mg/dL (ref 8.9–10.3)
Chloride: 101 mmol/L (ref 98–111)
Creatinine, Ser: 1.11 mg/dL — ABNORMAL HIGH (ref 0.44–1.00)
GFR, Estimated: 49 mL/min — ABNORMAL LOW (ref >60)
Glucose, Bld: 93 mg/dL (ref 70–99)
Potassium: 4.1 mmol/L (ref 3.5–5.1)
Sodium: 136 mmol/L (ref 135–145)

## 2022-07-16 LAB — CBC
HCT: 47.2 % — ABNORMAL HIGH (ref 36.0–46.0)
Hemoglobin: 15 g/dL (ref 12.0–15.0)
MCH: 29.2 pg (ref 26.0–34.0)
MCHC: 31.8 g/dL (ref 30.0–36.0)
MCV: 91.8 fL (ref 80.0–100.0)
Platelets: 331 10*3/uL (ref 150–400)
RBC: 5.14 MIL/uL — ABNORMAL HIGH (ref 3.87–5.11)
RDW: 12.8 % (ref 11.5–15.5)
WBC: 5.6 10*3/uL (ref 4.0–10.5)
nRBC: 0 % (ref 0.0–0.2)

## 2022-07-16 LAB — COMPREHENSIVE METABOLIC PANEL
ALT: 22 U/L (ref 0–44)
AST: 34 U/L (ref 15–41)
Albumin: 4.3 g/dL (ref 3.5–5.0)
Alkaline Phosphatase: 75 U/L (ref 38–126)
Anion gap: 9 (ref 5–15)
BUN: 11 mg/dL (ref 8–23)
CO2: 25 mmol/L (ref 22–32)
Calcium: 9.4 mg/dL (ref 8.9–10.3)
Chloride: 101 mmol/L (ref 98–111)
Creatinine, Ser: 1 mg/dL (ref 0.44–1.00)
GFR, Estimated: 56 mL/min — ABNORMAL LOW (ref >60)
Glucose, Bld: 93 mg/dL (ref 70–99)
Potassium: 4 mmol/L (ref 3.5–5.1)
Sodium: 135 mmol/L (ref 135–145)
Total Bilirubin: 1.1 mg/dL (ref 0.3–1.2)
Total Protein: 7.3 g/dL (ref 6.5–8.1)

## 2022-07-16 LAB — TROPONIN I (HIGH SENSITIVITY)
Troponin I (High Sensitivity): 7 ng/L (ref <18)
Troponin I (High Sensitivity): 8 ng/L (ref <18)

## 2022-07-16 MED ORDER — PIPERACILLIN-TAZOBACTAM 3.375 G IVPB
3.3750 g | Freq: Three times a day (TID) | INTRAVENOUS | Status: DC
  Administered 2022-07-17 – 2022-07-18 (×4): 3.375 g via INTRAVENOUS
  Filled 2022-07-16 (×4): qty 50

## 2022-07-16 MED ORDER — LACTATED RINGERS IV SOLN: INTRAVENOUS | Status: AC

## 2022-07-16 MED ORDER — ROSUVASTATIN CALCIUM 10 MG PO TABS
20.0000 mg | ORAL_TABLET | Freq: Every day | ORAL | Status: DC
  Administered 2022-07-17: 20 mg via ORAL
  Filled 2022-07-16: qty 1
  Filled 2022-07-16: qty 2

## 2022-07-16 MED ORDER — ONDANSETRON HCL 4 MG PO TABS: 4.0000 mg | ORAL_TABLET | Freq: Four times a day (QID) | ORAL | Status: DC | PRN

## 2022-07-16 MED ORDER — IOHEXOL 300 MG/ML  SOLN
100.0000 mL | Freq: Once | INTRAMUSCULAR | Status: AC | PRN
  Administered 2022-07-16: 100 mL via INTRAVENOUS

## 2022-07-16 MED ORDER — ANASTROZOLE 1 MG PO TABS: 1.0000 mg | ORAL_TABLET | Freq: Every day | ORAL | Status: DC

## 2022-07-16 MED ORDER — ALBUTEROL SULFATE (2.5 MG/3ML) 0.083% IN NEBU: 2.5000 mg | INHALATION_SOLUTION | Freq: Four times a day (QID) | RESPIRATORY_TRACT | Status: DC | PRN

## 2022-07-16 MED ORDER — RIVAROXABAN 20 MG PO TABS
20.0000 mg | ORAL_TABLET | Freq: Every day | ORAL | Status: DC
  Administered 2022-07-17 (×2): 20 mg via ORAL
  Filled 2022-07-16 (×4): qty 1

## 2022-07-16 MED ORDER — PIPERACILLIN-TAZOBACTAM 3.375 G IVPB 30 MIN
3.3750 g | Freq: Once | INTRAVENOUS | Status: AC
  Administered 2022-07-16: 3.375 g via INTRAVENOUS
  Filled 2022-07-16: qty 50

## 2022-07-16 MED ORDER — SODIUM CHLORIDE 0.9 % IV BOLUS
500.0000 mL | Freq: Once | INTRAVENOUS | Status: AC
  Administered 2022-07-16: 500 mL via INTRAVENOUS

## 2022-07-16 MED ORDER — ACETAMINOPHEN 650 MG RE SUPP: 650.0000 mg | Freq: Four times a day (QID) | RECTAL | Status: DC | PRN

## 2022-07-16 MED ORDER — METOPROLOL SUCCINATE ER 100 MG PO TB24
100.0000 mg | ORAL_TABLET | Freq: Every day | ORAL | Status: DC
  Administered 2022-07-17 (×2): 100 mg via ORAL
  Filled 2022-07-16: qty 2
  Filled 2022-07-16: qty 1

## 2022-07-16 MED ORDER — LOSARTAN POTASSIUM 50 MG PO TABS
100.0000 mg | ORAL_TABLET | Freq: Every day | ORAL | Status: DC
  Administered 2022-07-17: 100 mg via ORAL
  Filled 2022-07-16: qty 2

## 2022-07-16 MED ORDER — METOPROLOL SUCCINATE ER 50 MG PO TB24: 100.0000 mg | ORAL_TABLET | Freq: Every day | ORAL | Status: DC

## 2022-07-16 MED ORDER — MORPHINE SULFATE (PF) 2 MG/ML IV SOLN: 2.0000 mg | INTRAVENOUS | Status: DC | PRN

## 2022-07-16 MED ORDER — ONDANSETRON HCL 4 MG/2ML IJ SOLN: 4.0000 mg | Freq: Four times a day (QID) | INTRAMUSCULAR | Status: DC | PRN

## 2022-07-16 MED ORDER — METOPROLOL TARTRATE 5 MG/5ML IV SOLN
2.5000 mg | Freq: Once | INTRAVENOUS | Status: AC
  Administered 2022-07-16: 2.5 mg via INTRAVENOUS
  Filled 2022-07-16: qty 5

## 2022-07-16 MED ORDER — FAMOTIDINE 20 MG PO TABS
20.0000 mg | ORAL_TABLET | Freq: Every day | ORAL | Status: DC
  Administered 2022-07-17: 20 mg via ORAL
  Filled 2022-07-16: qty 1

## 2022-07-16 MED ORDER — ACETAMINOPHEN 325 MG PO TABS: 650.0000 mg | ORAL_TABLET | Freq: Four times a day (QID) | ORAL | Status: DC | PRN

## 2022-07-16 MED ORDER — HYDROCODONE-ACETAMINOPHEN 5-325 MG PO TABS
1.0000 | ORAL_TABLET | ORAL | Status: DC | PRN
  Administered 2022-07-17: 1 via ORAL
  Filled 2022-07-16: qty 1

## 2022-07-16 MED ORDER — METOPROLOL SUCCINATE ER 50 MG PO TB24
50.0000 mg | ORAL_TABLET | Freq: Every day | ORAL | Status: DC
Start: 2022-07-16 — End: 2022-07-18
  Administered 2022-07-17 (×2): 50 mg via ORAL
  Filled 2022-07-16 (×2): qty 1

## 2022-07-16 NOTE — Assessment & Plan Note (Signed)
Not acutely exacerbated.  Continue albuterol as needed 

## 2022-07-16 NOTE — Assessment & Plan Note (Addendum)
History of CVA 2013 Continue Crestor and Xarelto

## 2022-07-16 NOTE — Assessment & Plan Note (Addendum)
S/p lumpectomy and XRT.   Patient no longer takes anastrozole due to side effects

## 2022-07-16 NOTE — ED Triage Notes (Addendum)
Pt presents with diarrhea x 2 days and vomited once yesterday. Pts son states this happens often at least 2 cases a month for the past year. Pt also c/o migraines x 1 week.

## 2022-07-16 NOTE — Assessment & Plan Note (Signed)
No acute issues suspected.  Last follow-up with cardiology May 2023

## 2022-07-16 NOTE — Assessment & Plan Note (Signed)
Stable.  On Qulipta and Nurtec and Aimovig (pending med reconciliation)

## 2022-07-16 NOTE — Assessment & Plan Note (Signed)
Creatinine stable, minimally above baseline

## 2022-07-16 NOTE — ED Provider Notes (Signed)
[facility] Provider Note    Event Date/Time   First MD Initiated Contact with Patient 07/16/22 1817     (approximate)  History   Chief Complaint: Chest Pain  HPI  Traci Holland is a 86 y.o. female with a past medical history of paroxysmal atrial fibrillation on Xarelto, CKD, gastric reflux, hypertension, presents to the emergency department for evaluation.  According to the patient and son for the past several days the patient has been experiencing episodes of diarrhea and had nausea and vomiting earlier this morning.  Son was concerned so they went to the urgent care.  While at urgent care patient was noted to be in atrial fibrillation with rapid ventricular response.  Patient states she has a history of atrial fibrillation but is usually rate controlled with metoprolol at home.  Patient is also on Xarelto as a blood thinner.  Given the fast heart rate no further work-up was performed at the urgent care and the patient was sent to the emergency department for evaluation.  Here the patient is in atrial fibrillation with rapid ventricular response around 100 to 120 bpm.  Patient denies any abdominal pain although states earlier today she was having abdominal pain when she was nauseated.  Patient has had vomiting and diarrhea.  Physical Exam   Triage Vital Signs: ED Triage Vitals  Enc Vitals Group     BP 07/16/22 1737 (!) 149/92     Pulse Rate 07/16/22 1737 73     Resp 07/16/22 1737 20     Temp 07/16/22 1741 98.3 F (36.8 C)     Temp Source 07/16/22 1741 Oral     SpO2 07/16/22 1737 97 %     Weight 07/16/22 1738 170 lb (77.1 kg)     Height 07/16/22 1738 5' 6.5" (1.689 m)     Head Circumference --      Peak Flow --      Pain Score 07/16/22 1738 0     Pain Loc --      Pain Edu? --      Excl. in [place]? --     Most recent vital signs: Vitals:   07/16/22 1741 07/16/22 1830  BP:  (!) 140/69  Pulse:  64  Resp:  12  Temp: 98.3 F (36.8 C)   SpO2:  96%     General: Awake, no distress.  CV:  Good peripheral perfusion.  Irregular rhythm rate around 120 bpm. Resp:  Normal effort.  Equal breath sounds bilaterally.  Abd:  No distention.  Soft, mild abdominal tenderness to palpation no rebound or guarding.   ED Results / Procedures / Treatments   EKG  EKG viewed and interpreted by myself shows atrial fibrillation 107 bpm with a narrow QRS normal axis, nonspecific ST changes.  RADIOLOGY  I have reviewed and interpreted the x-ray images I do not see any obvious opacifications on my evaluation. Radiology is read the x-ray is negative. CT scan is consistent with left-sided colitis.  MEDICATIONS ORDERED IN ED: Medications  metoprolol tartrate (LOPRESSOR) injection 2.5 mg (has no administration in time range)  sodium chloride 0.9 % bolus 500 mL (has no administration in time range)  metoprolol tartrate (LOPRESSOR) injection 2.5 mg (2.5 mg Intravenous Given 07/16/22 1845)  iohexol (OMNIPAQUE) 300 MG/ML solution 100 mL (100 mLs Intravenous Contrast Given 07/16/22 1915)     IMPRESSION / MDM / ASSESSMENT AND PLAN / ED COURSE  I reviewed the triage vital signs and the nursing notes.  Patient's presentation is most consistent with acute presentation with potential threat to life or bodily function.  Patient presents to the emergency department for nausea vomiting diarrhea now with A-fib with RVR.  Patient is on metoprolol and Xarelto at baseline.  Patient given several doses of 2.5 mg of IV metoprolol with some improvement in the heart rate.  Patient CT scan has resulted consistent with colitis.  Given the patient's A-fib with RVR weakness nausea vomiting and diarrhea now with colitis we will start the patient on IV antibiotics continue with rate control as needed.  Patient's chemistry is resulted largely within normal limits, troponin reassuringly negative x2 and a normal CBC.  Patient admitted to the hospital service for ongoing work-up and  treatment.  Patient agreeable to plan of care.  CRITICAL CARE Performed by: Harvest Dark   Total critical care time: 30 minutes  Critical care time was exclusive of separately billable procedures and treating other patients.  Critical care was necessary to treat or prevent imminent or life-threatening deterioration.  Critical care was time spent personally by me on the following activities: development of treatment plan with patient and/or surrogate as well as nursing, discussions with consultants, evaluation of patient's response to treatment, examination of patient, obtaining history from patient or surrogate, ordering and performing treatments and interventions, ordering and review of laboratory studies, ordering and review of radiographic studies, pulse oximetry and re-evaluation of patient's condition.   FINAL CLINICAL IMPRESSION(S) / ED DIAGNOSES   Atrial fibrillation with rapid ventricular response Colitis   Note:  This document was prepared using Dragon voice recognition software and may include unintentional dictation errors.   Harvest Dark, MD 07/16/22 [phone]

## 2022-07-16 NOTE — ED Notes (Addendum)
Pt to ED from UC, pt states she has had N/V/D, and generalized abdominal pain intermittently for a couple of days. Pt also c/o weakness for the past couple of days. Pt has L sided CP that started today at UC, and occasional SOB.  Pt has hx of a-fib, pt believes that the weakness comes from that.  Pt has hx of migraines   Pt is A&Ox4.

## 2022-07-16 NOTE — ED Triage Notes (Signed)
Patient brought by EMS from UC in [facility] for chest pain while being seen. Patient reports she went to UC to be seen for diarrhea spells and N/V that has been intermittent for a year. C/o migraine X1 week, palpitations last night and weakness.   Hx A-fib.

## 2022-07-16 NOTE — Assessment & Plan Note (Addendum)
Chronic anticoagulation with Xarelto Rate controlled with 2 small IV doses of metoprolol in the ED Rate likely driven by acute colitis We will continue home metoprolol succinate 100 mg every morning and 50 mg every afternoon Continue Xarelto for stroke prevention

## 2022-07-16 NOTE — ED Notes (Signed)
Patient is being discharged from the Urgent Care and sent to the Emergency Department via EMS . Per Dr Susa Simmonds, patient is in need of higher level of care due to chest pain/ afib with RVR. Patient is aware and verbalizes understanding of plan of care.  Vitals:   07/16/22 1534 07/16/22 1637  BP: (!) 141/74   Pulse: (!) 40 (!) 111  Resp: 16   Temp: 98.2 F (36.8 C)   SpO2: 99%

## 2022-07-16 NOTE — Assessment & Plan Note (Signed)
Stable.  Continue meclizine

## 2022-07-16 NOTE — Assessment & Plan Note (Signed)
Currently chest pain-free and is likely related to rapid A-fib Troponin negative.  Chest pressure likely related to rapid A-fib.  Nonischemic EKG  Nonischemic stress test January 2022 Consider cardiology consult for further risk stratification if repeated episodes following rate control of A-fib

## 2022-07-16 NOTE — Discharge Instructions (Addendum)
We are sending you to the ED for your elevated heart rate and new chest discomfort, I suspect is due to your atrial fibrillation.

## 2022-07-16 NOTE — Assessment & Plan Note (Addendum)
CT abdomen and pelvis shows "wall thickening in the hepatic flexure, transverse colon and descending colon suggesting inflammatory or infectious colitis". Clear liquids IV fluids, pain control, IV antiemetics and supportive care Follow GI panel We will continue Zosyn from the ED

## 2022-07-16 NOTE — ED Provider Notes (Signed)
[facility]    CSN: [id number] Arrival date & time: 07/16/22  1518      History   Chief Complaint Chief Complaint  Patient presents with   Diarrhea    for several days and off and on for a few weeks; nauseated and weak - Entered by patient   Abdominal Pain   Fatigue   Headache    HPI Traci Holland is a 86 y.o. female.   HPI  Patient presents with her son for ongoing intermittent diarrhea for the past year or so.  She notes that she has bouts of explosive diarrhea and watery diarrhea.  One of her stools was dark.  Her son is concerned as the diarrhea is ongoing.  The family has been trying to get her to go to a GI doctor however patient is reluctant to get a colonoscopy bowel prep.  She does have some abdominal pain in the lower parts of her abdomen.  Denies any vaginal discharge or dysuria.  She did have an episode of vomiting earlier this morning.  She does have intermittent nausea been ongoing.  The family has been trying to get her to eat more.  She states that she ate a piece of toast this morning and often drinks ginger ale.  She does not have an appetite.  Occasionally has headaches.  Of note, she does have well water that has not been tested recently.   She has been having palpitations.  She has history of atrial fibrillation and is on Xarelto and metoprolol XL (100 mg and 50 mg).  She has been taking her medication regularly.  She does occasionally take a diuretic when she feels volume overloaded.  She has not taken 1 recently.  She does note that her pants sometimes differently.    Past Medical History:  Diagnosis Date   Asthma    Atrial fibrillation ([place])    Cancer ([place])    breast ca  08/2019   Chronic kidney disease (CKD), stage III (moderate) (HCC)    Complication of anesthesia    hard to get to sleep then slow to wake for breast biopsy.   Dyspnea    Elevated lipids    GERD (gastroesophageal reflux disease)    Headache    Migraines   History of hiatal  hernia    Hypertension    Lung nodule    Mitral valve disorder    PAF (paroxysmal atrial fibrillation) ([place])    ablation 2013   Stroke [facility]) 2013   no deficits   Vertigo    last episode 2-3 yrs ago   Wears hearing aid in both ears     Patient Active Problem List   Diagnosis Date Noted   Acute colitis 07/16/2022   Stage 3b chronic kidney disease ([place]) 07/16/2022   Paroxysmal atrial fibrillation with RVR ([place]) 07/16/2022   Chest pain 07/16/2022   History of breast cancer 07/16/2022   Asthma    History of CVA (cerebrovascular accident)    Cardiac valvulopathy    Chronic migraine without aura or status migrainosus    Vertigo    Malignant neoplasm of upper-outer quadrant of left breast in female, estrogen receptor positive ([place]) 12/22/2019   Ataxia 08/09/2018    Past Surgical History:  Procedure Laterality Date   BREAST BIOPSY Left 04/23/2010   bx /clip-neg   BREAST BIOPSY Right 2010   neg   cardiac ablasion     CATARACT EXTRACTION W/PHACO Left 10/22/2020   Procedure: CATARACT EXTRACTION  PHACO AND INTRAOCULAR LENS PLACEMENT (IOC) LEFT 10.03 01:30.4 11.1%;  Surgeon: Leandrew Koyanagi, MD;  Location: [facility];  Service: Ophthalmology;  Laterality: Left;   CATARACT EXTRACTION W/PHACO Right 11/13/2020   Procedure: CATARACT EXTRACTION PHACO AND INTRAOCULAR LENS PLACEMENT ([place]) RIGHT;  Surgeon: Leandrew Koyanagi, MD;  Location: [facility];  Service: Ophthalmology;  Laterality: Right;  10.08 1:20.4 13.6%   COLONOSCOPY     COLONOSCOPY WITH PROPOFOL N/A 01/25/2018   Procedure: COLONOSCOPY WITH PROPOFOL;  Surgeon: Lollie Sails, MD;  Location: [facility] ENDOSCOPY;  Service: Endoscopy;  Laterality: N/A;   DIRECT LARYNGOSCOPY Left 02/18/2018   Procedure: DIRECT LARYNGOSCOPY WITH EXCISION LARYNGEAL LESION;  Surgeon: Margaretha Sheffield, MD;  Location: [facility];  Service: ENT;  Laterality: Left;   ESOPHAGOGASTRODUODENOSCOPY (EGD) WITH PROPOFOL N/A 01/25/2018   Procedure:  ESOPHAGOGASTRODUODENOSCOPY (EGD) WITH PROPOFOL;  Surgeon: Lollie Sails, MD;  Location: [facility] ENDOSCOPY;  Service: Endoscopy;  Laterality: N/A;   HERNIA REPAIR     pelvic surgery x2 for post-partum hemmoghage N/A    TONSILLECTOMY     TUBAL LIGATION      OB History   No obstetric history on file.      Home Medications    Prior to Admission medications   Medication Sig Start Date End Date Taking? Authorizing Provider  albuterol (VENTOLIN HFA) 108 (90 Base) MCG/ACT inhaler Inhale into the lungs every 6 (six) hours as needed for wheezing or shortness of breath.    [provider]  anastrozole (ARIMIDEX) 1 MG tablet Take 1 tablet by mouth daily. Patient not taking: Reported on 07/16/2022    [provider]  Cholecalciferol 50 MCG (2000 UT) CAPS Take 1,000 Units by mouth daily with supper.  Patient not taking: Reported on 07/16/2022    [provider]  cyclobenzaprine (FLEXERIL) 10 MG tablet Take 10 mg by mouth 3 (three) times daily as needed for muscle spasms.    [provider]  DENTAGEL 1.1 % GEL dental gel Take 1 Application by mouth at bedtime. Apply to teeth every night at bedtime. 03/10/22   [provider]  Erenumab-aooe (AIMOVIG) 140 MG/ML SOAJ Inject 140 mg into the skin every 28 (twenty-eight) days.  Patient not taking: Reported on 07/16/2022    [provider]  ezetimibe (ZETIA) 10 MG tablet Take 10 mg by mouth daily with supper.     [provider]  famotidine (PEPCID) 20 MG tablet Take 20 mg by mouth daily with supper.    [provider]  fexofenadine (ALLEGRA) 60 MG tablet Take 120 mg by mouth daily.    [provider]  fluticasone (VERAMYST) 27.5 MCG/SPRAY nasal spray Place 1-2 sprays into the nose 2 (two) times daily as needed for rhinitis.    [provider]  furosemide (LASIX) 20 MG tablet Take 20 mg by mouth daily as needed (fluid retention).     [provider]  losartan  (COZAAR) 100 MG tablet Take 100 mg by mouth daily with supper.     [provider]  meclizine (ANTIVERT) 25 MG tablet Take 1 tablet (25 mg total) by mouth 3 (three) times daily as needed for dizziness. Patient taking differently: Take 25 mg by mouth every 6 (six) hours as needed (vertigo). 07/28/18   Coral Spikes, DO  metoprolol succinate (TOPROL-XL) 100 MG 24 hr tablet Take 100 mg by mouth daily. 100 mg + 50 mg=150 mg at supper Take with or immediately following a meal.    [provider]  metoprolol succinate (TOPROL-XL) 50 MG 24 hr tablet Take 50 mg by mouth daily with supper. 100 mg + 50 mg=150 mg at supperTake with or immediately following a meal.    [provider]  NURTEC 75 MG TBDP Take 75 mg by mouth every 2 (two) hours as needed for migraine. 10/22/20   [provider]  ondansetron (ZOFRAN) 4 MG tablet Take 4 mg by mouth every 8 (eight) hours as needed for nausea or vomiting. Patient not taking: Reported on 07/16/2022    [provider]  ondansetron (ZOFRAN-ODT) 4 MG disintegrating tablet Take 1 tablet (4 mg total) by mouth every 8 (eight) hours as needed for nausea or vomiting. 04/11/22   Harvest Dark, MD  [facility] OP Place 1 drop into the left eye daily. Patient not taking: Reported on 07/16/2022    [provider]  promethazine (PHENERGAN) 25 MG tablet Take 25 mg by mouth every 6 (six) hours as needed for nausea or vomiting.    [provider]  QULIPTA 60 MG TABS Take 1 tablet by mouth daily. 06/25/22   [provider]  rivaroxaban (XARELTO) 20 MG TABS tablet Take 20 mg by mouth daily with supper.    [provider]  rosuvastatin (CRESTOR) 20 MG tablet Take 20 mg by mouth daily with supper.     [provider]  sertraline (ZOLOFT) 25 MG tablet Take 25 mg by mouth daily. Patient not taking: Reported on 07/16/2022    [provider]    Family History Family History   Problem Relation Age of Onset   Breast cancer Daughter 72    Social History Social History   Tobacco Use   Smoking status: Never   Smokeless tobacco: Never  Vaping Use   Vaping Use: Never used  Substance Use Topics   Alcohol use: No   Drug use: No     Allergies   Atorvastatin, Colcrys [colchicine], Other, and Propafenone   Review of Systems Review of Systems  Unable to perform ROS: Acuity of condition  See HPI.  ROS truncated due to onset of A-fib with RVR.    Physical Exam Triage Vital Signs ED Triage Vitals [07/16/22 1529]  Enc Vitals Group     BP      Pulse      Resp      Temp      Temp src      SpO2      Weight      Height      Head Circumference      Peak Flow      Pain Score 0     Pain Loc      Pain Edu?      Excl. in [place]?    No data found.  Updated Vital Signs BP (!) 141/74 (BP Location: Right Arm)   Pulse (!) 111   Temp 98.2 F (36.8 C) (Oral)   Resp 16   LMP  (LMP Unknown)   SpO2 99%   Visual Acuity Right Eye Distance:   Left Eye Distance:   Bilateral Distance:    Right Eye Near:   Left Eye Near:    Bilateral Near:     Physical Exam  GEN: pleasant elderly female, resting on exam table CV: Irregularly irregular rhythm and tachycardic, no murmurs appreciated, no JVP RESP: no increased work of breathing, clear to ascultation bilaterally ABD: Bowel sounds present. Soft, epigastric and lower tenderness, mild-distension.  MSK: no LE edema  SKIN: warm, dry, ?pallor NEURO: alert, oriented, moves all extremities appropriately, normal tone   UC Treatments / Results  Labs (all labs ordered are listed, but only abnormal results are displayed) Labs Reviewed  COMPREHENSIVE METABOLIC PANEL - Abnormal; Notable for the following components:      Result Value   GFR, Estimated 56 (*)    All other components within normal limits  CBC WITH DIFFERENTIAL/PLATELET    EKG AFIB with RVR, no acute ST or T wave changes, reviewed by me.    Radiology CT ABDOMEN PELVIS W CONTRAST  Result Date: 07/16/2022 CLINICAL DATA:  Abdominal pain EXAM: CT ABDOMEN AND PELVIS WITH CONTRAST TECHNIQUE: Multidetector CT imaging of the abdomen and pelvis was performed using the standard protocol following bolus administration of intravenous contrast. RADIATION DOSE REDUCTION: This exam was performed according to the departmental dose-optimization program which includes automated exposure control, adjustment of the mA and/or kV according to patient size and/or use of iterative reconstruction technique. CONTRAST:  171m OMNIPAQUE IOHEXOL 300 MG/ML  SOLN COMPARISON:  04/11/2022 FINDINGS: Lower chest: There is almost complete clearing of a small patchy density seen in left lower lobe. In the current study, there is thin linear density in the lateral aspect of left lower lung field, most likely scarring. No new infiltrates are seen. There is no pleural effusion. Hepatobiliary: There is fatty infiltration liver. There is no dilation of bile ducts. Gallbladder is not distended. Pancreas: There is fatty infiltration. No focal abnormalities are seen. Spleen: There are a few low-density lesions in the spleen each measuring less than 12 mm, possibly cysts or hemangiomas. There is coarse calcification suggesting granuloma. Adrenals/Urinary Tract: Adrenals are unremarkable. There is no hydronephrosis. There are no renal or ureteral stones. There is a 7 mm low-density structure in the upper pole of right kidney, possibly a cyst. Density measurement in this lesion is 12 Hounsfield units. There are few other smaller lesions in the renal cortex on both sides which are too small to characterize. Ureters are not dilated. Urinary bladder is unremarkable. Stomach/Bowel: Small hiatal hernia is seen. Stomach is not distended. Small bowel loops are not dilated. Appendix is not dilated. There is mild diffuse wall thickening in hepatic flexure, transverse colon and descending colon. There  is no pericolic stranding. Scattered diverticula are seen in colon, especially in sigmoid without signs of focal acute diverticulitis. Vascular/Lymphatic: Scattered arterial calcifications are seen. No new significant lymphadenopathy is seen. Reproductive: Unremarkable. Other: There is no ascites or pneumoperitoneum. Small umbilical hernia containing fat is seen. Dense calcification seen in the subcutaneous plane in the left inguinal region has not changed. Musculoskeletal: Spondylolysis is seen in the L5 vertebra with first-degree spondylolisthesis at the L5-S1 level. Degenerative changes are noted in lumbar spine with encroachment of neural foramina from L3-S1 levels. IMPRESSION: There is wall thickening in the hepatic flexure, transverse colon and descending colon suggesting inflammatory or infectious colitis. There is no evidence of intestinal obstruction or pneumoperitoneum. There is no hydronephrosis. Diverticulosis of colon without signs of focal diverticulitis. Small hiatal hernia. Fatty liver. There are low-density lesions in spleen suggesting cysts or hemangiomas. There are scattered arterial calcifications suggesting atherosclerosis. There is 7 mm right renal cyst which needs no follow-up. There are a few other tiny smaller low-density lesions in both kidneys which are too small to characterize. Follow-up multiphasic CT in 6 months may be considered. Electronically Signed   By: PElmer PickerM.D.   On: 07/16/2022 19:50   DG Chest 2 View  Result Date: 07/16/2022 CLINICAL DATA:  Chest pain EXAM: CHEST - 2 VIEW COMPARISON:  04/11/2022 FINDINGS: Heart size upper normal. Interval resolution of bilateral interstitial edema. Lungs now clear and vascularity is normal. No pleural effusion. Surgical clips in the left breast. IMPRESSION: No active cardiopulmonary disease. Electronically Signed   By: Franchot Gallo M.D.   On: 07/16/2022 18:08    Procedures Procedures (including critical care  time)  Medications Ordered in UC Medications - No data to display  Initial Impression / Assessment and Plan / UC Course  I have reviewed the triage vital signs and the nursing notes.  Pertinent labs & imaging results that were available during my care of the patient were reviewed by me and considered in my medical decision making (see chart for details).     Atrial Fibrillation with rapid ventricular rate Patient is a 86 year old female with history of A-fib (Xarelto ), CKD, hypertension and breast cancer who presented for diarrhea and nausea that has been intermittent for several years. During her work-up here she began having chest discomfort and found to be in AFIB with RVR.  Initial vitals suggested she was bradycardic but subsequent exam and EKGs she was tachycardic. Abdominal pain and diarrhea work-up was truncated due to acute onset A-fib with RVR.  Pt's son at the bedside. EMS was called and pt was transferred to the emergency department.  Spoke with [facility] charge RN and updated her on pt's symptoms and current diagnosis.  She will expect patient's arrival via EMS.     Final Clinical Impressions(s) / UC Diagnoses   Final diagnoses:  Atrial fibrillation with rapid ventricular response [facility])     Discharge Instructions      We are sending you to the ED for your elevated heart rate and new chest discomfort, I suspect is due to your atrial fibrillation.       ED Prescriptions   None    PDMP not reviewed this encounter.   Lyndee Hensen, DO 07/17/22 [phone]

## 2022-07-16 NOTE — Progress Notes (Signed)
Pharmacy Antibiotic Note  Traci Holland is a 86 y.o. female admitted on 07/16/2022 with  intra-abdominal infection .  Pharmacy has been consulted for Zosyn dosing.  Plan: Zosyn 3.375 gm IV X 1 given in ED on 8/3 @ 2039. Zosyn 3.375 gm IV Q8H EI ordered to start on 8/4 @ 0430.   Height: 5' 6.5" (168.9 cm) Weight: 77.1 kg (170 lb) IBW/kg (Calculated) : 60.45  Temp (24hrs), Avg:98.1 F (36.7 C), Min:97.9 F (36.6 C), Max:98.3 F (36.8 C)  Recent Labs  Lab 07/16/22 1620 07/16/22 1735  WBC 5.6 5.6  CREATININE 1.00 1.11*    Estimated Creatinine Clearance: 40 mL/min (A) (by C-G formula based on SCr of 1.11 mg/dL (H)).    Allergies  Allergen Reactions   Atorvastatin     Joint pain Other reaction(s): myalgias (muscle pain)   Colcrys [Colchicine] Diarrhea   Other Rash    Chlorhexidine site prep for PIV placement   Propafenone Palpitations    Antimicrobials this admission:   >>    >>   Dose adjustments this admission:   Microbiology results:  BCx:   UCx:    Sputum:    MRSA PCR:   Thank you for allowing pharmacy to be a part of this patient's care.  Samuel Rittenhouse D 07/16/2022 10:47 PM

## 2022-07-16 NOTE — H&P (Signed)
History and Physical    Patient: Traci Holland ZJQ:[id number] DOB: November 23, 1936 DOA: 07/16/2022 DOS: the patient was seen and examined on 07/16/2022 PCP: Dion Body, MD  Patient coming from: Home  Chief Complaint:  Chief Complaint  Patient presents with   Chest Pain    HPI: Traci Holland is a 86 y.o. female with medical history significant for Paroxysmal atrial fibrillation on Xarelto, with AF catheter ablation in 2013, prior MCA stroke 2013, chronic migraines, breast cancer s/p lumpectomy and XRT, HTN, asthma, mild valvulopathy, nonischemic stress test January 2022, seen by her cardiologist on 05/13/2022 related to an ED visit for chest pain and cleared with a 12-monthfollow-up, who presents to the ED for evaluation of a 2-day history of nausea vomiting, diarrhea and poor oral intake associated with weakness.  She has tenderness to the lower abdomen.  She denies fever or chills or dysuria.  She also notes intermittent chest pressure.  She was taken to urgent care where an EKG revealed rapid A-fib in the 140s and she was sent to the emergency room. ED course and data review: On arrival heart rate was in the 130s with otherwise normal vitals and BP of 149/92. Labs: CBC and BMP at baseline and troponin of 8 EKG, personally viewed and interpreted: A-fib at 90 with variable first-degree block.  No acute ST-T wave changes Chest x-ray nonacute CT abdomen and pelvis showed the following: IMPRESSION: There is wall thickening in the hepatic flexure, transverse colon and descending colon suggesting inflammatory or infectious colitis.  There is no evidence of intestinal obstruction or pneumoperitoneum. There is no hydronephrosis.  Diverticulosis of colon without signs of focal diverticulitis. Small hiatal hernia. Fatty liver. There are low-density lesions in spleen suggesting cysts or hemangiomas. There are scattered arterial calcifications suggesting atherosclerosis.  There is 7 mm right renal  cyst which needs no follow-up.  There are a few other tiny smaller low-density lesions in both kidneys which are too small to characterize. Follow-up multiphasic CT in 6 months may be considered.  Patient was treated with 2 doses of metoprolol IV 2.5 mg with improvement in heart rate to 95 by admission.  She was given an IV fluid bolus and a dose of Zosyn.  Hospitalist consulted for admission for rapid A-fib and acute colitis.   Review of Systems: As mentioned in the history of present illness. All other systems reviewed and are negative.  Past Medical History:  Diagnosis Date   Asthma    Atrial fibrillation ([facility]    [place]    breast ca  08/2019   Chronic kidney disease (CKD), stage III (moderate) (HCC)    Complication of anesthesia    hard to get to sleep then slow to wake for breast biopsy.   Dyspnea    Elevated lipids    GERD (gastroesophageal reflux disease)    Headache    Migraines   History of hiatal hernia    Hypertension    Lung nodule    Mitral valve disorder    PAF ([place]mal [place]) [place]    ablation 2013   Stroke [facility] 2013   no deficits   Vertigo    last episode 2-3 yrs ago   Wears hearing aid in both ears    Past Surgical History:  Procedure Laterality Date   BREAST BIOPSY Left 04/23/2010   bx /clip-neg   BREAST BIOPSY Right 2010   neg   cardiac ablasion     [facility]  Procedure: CATARACT EXTRACTION PHACO AND INTRAOCULAR LENS PLACEMENT ([facility]) LEFT 10.03 01:30.4 11.1%;  Surgeon: Leandrew Koyanagi, MD;  Location: [place];  Service: Ophthalmology;  Laterality: Left;   CATARACT EXTRACTION W/PHACO Right 11/13/2020   Procedure: CATARACT EXTRACTION PHACO AND INTRAOCULAR LENS PLACEMENT ([facility]) RIGHT;  Surgeon: Leandrew Koyanagi, MD;  Location: [facility];  Service: Ophthalmology;  Laterality: Right;  10.08 1:20.4 13.6%   COLONOSCOPY     COLONOSCOPY WITH PROPOFOL N/A 01/25/2018   Procedure:  COLONOSCOPY WITH PROPOFOL;  Surgeon: Lollie Sails, MD;  Location: [facility] ENDOSCOPY;  Service: Endoscopy;  Laterality: N/A;   DIRECT LARYNGOSCOPY Left 02/18/2018   Procedure: DIRECT LARYNGOSCOPY WITH EXCISION LARYNGEAL LESION;  Surgeon: Margaretha Sheffield, MD;  Location: [place];  Service: ENT;  Laterality: Left;   ESOPHAGOGASTRODUODENOSCOPY (EGD) WITH PROPOFOL N/A 01/25/2018   Procedure: ESOPHAGOGASTRODUODENOSCOPY (EGD) WITH PROPOFOL;  Surgeon: Lollie Sails, MD;  Location: [facility] ENDOSCOPY;  Service: Endoscopy;  Laterality: N/A;   HERNIA REPAIR     pelvic surgery x2 for post-partum hemmoghage N/A    TONSILLECTOMY     TUBAL LIGATION     Social History:  reports that she has never smoked. She has never used smokeless tobacco. She reports that she does not drink alcohol and does not use drugs.  Allergies  Allergen Reactions   Colcrys [Colchicine] Diarrhea   Lipitor [Atorvastatin]     Joint pain   Other Rash    Chlorhexidine site prep for PIV placement   Propafenone Palpitations    Family History  Problem Relation Age of Onset   Breast cancer Daughter 63    Prior to Admission medications   Medication Sig Start Date End Date Taking? Authorizing Provider  albuterol (VENTOLIN HFA) 108 (90 Base) MCG/ACT inhaler Inhale into the lungs every 6 (six) hours as needed for wheezing or shortness of breath.    [provider]  anastrozole (ARIMIDEX) 1 MG tablet Take 1 tablet by mouth daily.    [provider]  Cholecalciferol 50 MCG (2000 UT) CAPS Take 1,000 Units by mouth daily with supper.     [provider]  cyclobenzaprine (FLEXERIL) 10 MG tablet Take 10 mg by mouth 3 (three) times daily as needed for muscle spasms.    [provider]  Erenumab-aooe (AIMOVIG) 140 MG/ML SOAJ Inject 140 mg into the skin every 28 (twenty-eight) days.     [provider]  ezetimibe (ZETIA) 10 MG tablet Take 10 mg by mouth daily with supper.     [provider]  famotidine (PEPCID) 20 MG tablet Take 20 mg by mouth daily with supper.    [provider]  fexofenadine (ALLEGRA) 60 MG tablet Take 60 mg by mouth daily.    [provider]  fluticasone (VERAMYST) 27.5 MCG/SPRAY nasal spray Place 1-2 sprays into the nose 2 (two) times daily as needed for rhinitis.    [provider]  furosemide (LASIX) 20 MG tablet Take 20 mg by mouth daily as needed (fluid retention).     [provider]  losartan (COZAAR) 100 MG tablet Take 100 mg by mouth daily with supper.     [provider]  meclizine (ANTIVERT) 25 MG tablet Take 1 tablet (25 mg total) by mouth 3 (three) times daily as needed for dizziness. Patient taking differently: Take 12.5-25 mg by mouth every 6 (six) hours as needed (vertigo). 07/28/18   Coral Spikes, DO  metoprolol succinate (TOPROL-XL) 100 MG 24 hr tablet Take 100 mg  by mouth daily. 100 mg + 50 mg=150 mg at supper Take with or immediately following a meal.    [provider]  metoprolol succinate (TOPROL-XL) 50 MG 24 hr tablet Take 50 mg by mouth daily with supper. 100 mg + 50 mg=150 mg at supperTake with or immediately following a meal.    [provider]  NURTEC 75 MG TBDP Take 75 mg by mouth every 2 (two) hours as needed for migraine. 10/22/20   [provider]  ondansetron (ZOFRAN) 4 MG tablet Take 4 mg by mouth every 8 (eight) hours as needed for nausea or vomiting.    [provider]  ondansetron (ZOFRAN-ODT) 4 MG disintegrating tablet Take 1 tablet (4 mg total) by mouth every 8 (eight) hours as needed for nausea or vomiting. 04/11/22   Harvest Dark, MD  [facility] OP Place 1 drop into the left eye daily.    [provider]  promethazine (PHENERGAN) 25 MG tablet Take 25 mg by mouth every 6 (six) hours as needed for nausea or vomiting.    [provider]  QULIPTA 60 MG TABS Take 1 tablet by mouth daily. 06/25/22    [provider]  rivaroxaban (XARELTO) 20 MG TABS tablet Take 20 mg by mouth daily with supper.    [provider]  rosuvastatin (CRESTOR) 20 MG tablet Take 20 mg by mouth daily with supper.     [provider]  sertraline (ZOLOFT) 25 MG tablet Take 25 mg by mouth daily.    [provider]    Physical Exam: Vitals:   07/16/22 1737 07/16/22 1738 07/16/22 1741 07/16/22 1830  BP: (!) 149/92   (!) 140/69  Pulse: 73   64  Resp: 20   12  Temp:   98.3 F (36.8 C)   TempSrc:   Oral   SpO2: 97%   96%  Weight:  77.1 kg    Height:  5' 6.5" (1.689 m)     Physical Exam Vitals and nursing note reviewed.  Constitutional:      General: She is not in acute distress. HENT:     Head: Normocephalic and atraumatic.  Cardiovascular:     Rate and Rhythm: Tachycardia present. Rhythm irregular.     Heart sounds: Normal heart sounds.  Pulmonary:     Effort: Pulmonary effort is normal.     Breath sounds: Normal breath sounds.  Abdominal:     Palpations: Abdomen is soft.     Tenderness: There is abdominal tenderness in the periumbilical area.  Neurological:     Mental Status: Mental status is at baseline.     Labs on Admission: I have personally reviewed following labs and imaging studies  CBC: Recent Labs  Lab 07/16/22 1620 07/16/22 1735  WBC 5.6 5.6  NEUTROABS 3.1  --   HGB 14.9 15.0  HCT 44.9 47.2*  MCV 91.8 91.8  PLT 338 366   Basic Metabolic Panel: Recent Labs  Lab 07/16/22 1620 07/16/22 1735  NA 135 136  K 4.0 4.1  CL 101 101  CO2 25 23  GLUCOSE 93 93  BUN 11 11  CREATININE 1.00 1.11*  CALCIUM 9.4 9.4   GFR: Estimated Creatinine Clearance: 40 mL/min (A) (by C-G formula based on SCr of 1.11 mg/dL (H)). Liver Function Tests: Recent Labs  Lab 07/16/22 1620  AST 34  ALT 22  ALKPHOS 75  BILITOT 1.1  PROT 7.3  ALBUMIN 4.3   No results for input(s): "LIPASE", "AMYLASE" in  the last 168 hours. No results for input(s): "AMMONIA" in  the last 168 hours. Coagulation Profile: No results for input(s): "INR", "PROTIME" in the last 168 hours. Cardiac Enzymes: No results for input(s): "CKTOTAL", "CKMB", "CKMBINDEX", "TROPONINI" in the last 168 hours. BNP (last 3 results) No results for input(s): "PROBNP" in the last 8760 hours. HbA1C: No results for input(s): "HGBA1C" in the last 72 hours. CBG: No results for input(s): "GLUCAP" in the last 168 hours. Lipid Profile: No results for input(s): "CHOL", "HDL", "LDLCALC", "TRIG", "CHOLHDL", "LDLDIRECT" in the last 72 hours. Thyroid Function Tests: No results for input(s): "TSH", "T4TOTAL", "FREET4", "T3FREE", "THYROIDAB" in the last 72 hours. Anemia Panel: No results for input(s): "VITAMINB12", "FOLATE", "FERRITIN", "TIBC", "IRON", "RETICCTPCT" in the last 72 hours. Urine analysis:    Component Value Date/Time   COLORURINE YELLOW (A) 04/11/2022 1105   APPEARANCEUR CLEAR (A) 04/11/2022 1105   LABSPEC >1.046 (H) 04/11/2022 1105   PHURINE 5.0 04/11/2022 1105   GLUCOSEU NEGATIVE 04/11/2022 1105   HGBUR NEGATIVE 04/11/2022 1105   BILIRUBINUR NEGATIVE 04/11/2022 1105   KETONESUR NEGATIVE 04/11/2022 1105   PROTEINUR NEGATIVE 04/11/2022 1105   NITRITE NEGATIVE 04/11/2022 1105   LEUKOCYTESUR NEGATIVE 04/11/2022 1105    Radiological Exams on Admission: CT ABDOMEN PELVIS W CONTRAST  Result Date: 07/16/2022 CLINICAL DATA:  Abdominal pain EXAM: CT ABDOMEN AND PELVIS WITH CONTRAST TECHNIQUE: Multidetector CT imaging of the abdomen and pelvis was performed using the standard protocol following bolus administration of intravenous contrast. RADIATION DOSE REDUCTION: This exam was performed according to the departmental dose-optimization program which includes automated exposure control, adjustment of the mA and/or kV according to patient size and/or use of iterative reconstruction technique. CONTRAST:  187m OMNIPAQUE IOHEXOL 300 MG/ML  SOLN COMPARISON:  04/11/2022 FINDINGS: Lower chest:  There is almost complete clearing of a small patchy density seen in left lower lobe. In the current study, there is thin linear density in the lateral aspect of left lower lung field, most likely scarring. No new infiltrates are seen. There is no pleural effusion. Hepatobiliary: There is fatty infiltration liver. There is no dilation of bile ducts. Gallbladder is not distended. Pancreas: There is fatty infiltration. No focal abnormalities are seen. Spleen: There are a few low-density lesions in the spleen each measuring less than 12 mm, possibly cysts or hemangiomas. There is coarse calcification suggesting granuloma. Adrenals/Urinary Tract: Adrenals are unremarkable. There is no hydronephrosis. There are no renal or ureteral stones. There is a 7 mm low-density structure in the upper pole of right kidney, possibly a cyst. Density measurement in this lesion is 12 Hounsfield units. There are few other smaller lesions in the renal cortex on both sides which are too small to characterize. Ureters are not dilated. Urinary bladder is unremarkable. Stomach/Bowel: Small hiatal hernia is seen. Stomach is not distended. Small bowel loops are not dilated. Appendix is not dilated. There is mild diffuse wall thickening in hepatic flexure, transverse colon and descending colon. There is no pericolic stranding. Scattered diverticula are seen in colon, especially in sigmoid without signs of focal acute diverticulitis. Vascular/Lymphatic: Scattered arterial calcifications are seen. No new significant lymphadenopathy is seen. Reproductive: Unremarkable. Other: There is no ascites or pneumoperitoneum. Small umbilical hernia containing fat is seen. Dense calcification seen in the subcutaneous plane in the left inguinal region has not changed. Musculoskeletal: Spondylolysis is seen in the L5 vertebra with first-degree spondylolisthesis at the L5-S1 level. Degenerative changes are noted in lumbar spine with encroachment of neural  foramina from  L3-S1 levels. IMPRESSION: There is wall thickening in the hepatic flexure, transverse colon and descending colon suggesting inflammatory or infectious colitis. There is no evidence of intestinal obstruction or pneumoperitoneum. There is no hydronephrosis. Diverticulosis of colon without signs of focal diverticulitis. Small hiatal hernia. Fatty liver. There are low-density lesions in spleen suggesting cysts or hemangiomas. There are scattered arterial calcifications suggesting atherosclerosis. There is 7 mm right renal cyst which needs no follow-up. There are a few other tiny smaller low-density lesions in both kidneys which are too small to characterize. Follow-up multiphasic CT in 6 months may be considered. Electronically Signed   By: Elmer Picker M.D.   On: 07/16/2022 19:50   DG Chest 2 View  Result Date: 07/16/2022 CLINICAL DATA:  Chest pain EXAM: CHEST - 2 VIEW COMPARISON:  04/11/2022 FINDINGS: Heart size upper normal. Interval resolution of bilateral interstitial edema. Lungs now clear and vascularity is normal. No pleural effusion. Surgical clips in the left breast. IMPRESSION: No active cardiopulmonary disease. Electronically Signed   By: Franchot Gallo M.D.   On: 07/16/2022 18:08     Data Reviewed: Relevant notes from primary care and specialist visits, past discharge summaries as available in EHR, including Care Everywhere. Prior diagnostic testing as pertinent to current admission diagnoses Updated medications and problem lists for reconciliation ED course, including vitals, labs, imaging, treatment and response to treatment Triage notes, nursing and pharmacy notes and ED provider's notes Notable results as noted in HPI   Assessment and Plan: * Acute colitis CT abdomen and pelvis shows "wall thickening in the hepatic flexure, transverse colon and descending colon suggesting inflammatory or infectious colitis". Clear liquids IV fluids, pain control, IV antiemetics  and supportive care Follow GI panel We will continue Zosyn from the ED  Paroxysmal atrial fibrillation with RVR (HCC) Chronic anticoagulation with Xarelto Rate controlled with 2 small IV doses of metoprolol in the ED Rate likely driven by acute colitis We will continue home metoprolol succinate 100 mg every morning and 50 mg every afternoon Continue Xarelto for stroke prevention  Chest pain Currently chest pain-free and is likely related to rapid A-fib Troponin negative.  Chest pressure likely related to rapid A-fib.  Nonischemic EKG  Nonischemic stress test January 2022 Consider cardiology consult for further risk stratification if repeated episodes following rate control of A-fib   Stage 3b chronic kidney disease ([place]) Creatinine stable, minimally above baseline  Vertigo Stable.  Continue meclizine  Chronic migraine without aura or status migrainosus Stable.  On Qulipta and Nurtec and Aimovig (pending med reconciliation)  Cardiac valvulopathy No acute issues suspected.  Last follow-up with cardiology May 2023  History of CVA (cerebrovascular accident) History of CVA 2013 Continue Crestor and Xarelto  Asthma Not acutely exacerbated.  Continue albuterol as needed.  History of breast cancer S/p lumpectomy and XRT.   Patient no longer takes anastrozole due to side effects        DVT prophylaxis: Xarelto Consults: none  Advance Care Planning:   Code Status: Prior   Family Communication: none  Disposition Plan: Back to previous home environment  Severity of Illness: The appropriate patient status for this patient is OBSERVATION. Observation status is judged to be reasonable and necessary in order to provide the required intensity of service to ensure the patient's safety. The patient's presenting symptoms, physical exam findings, and initial radiographic and laboratory data in the context of their medical condition is felt to place them at decreased risk for  further clinical deterioration. Furthermore, it  is anticipated that the patient will be medically stable for discharge from the hospital within 2 midnights of admission.   Author: Athena Masse, MD 07/16/2022 8:53 PM  For on call review [email or web address].

## 2022-07-17 DIAGNOSIS — K529 Noninfective gastroenteritis and colitis, unspecified: Secondary | ICD-10-CM | POA: Diagnosis not present

## 2022-07-17 LAB — BASIC METABOLIC PANEL
Anion gap: 6 (ref 5–15)
BUN: 10 mg/dL (ref 8–23)
CO2: 23 mmol/L (ref 22–32)
Calcium: 8.7 mg/dL — ABNORMAL LOW (ref 8.9–10.3)
Chloride: 108 mmol/L (ref 98–111)
Creatinine, Ser: 0.83 mg/dL (ref 0.44–1.00)
GFR, Estimated: >60 mL/min (ref >60)
Glucose, Bld: 103 mg/dL — ABNORMAL HIGH (ref 70–99)
Potassium: 4.1 mmol/L (ref 3.5–5.1)
Sodium: 137 mmol/L (ref 135–145)

## 2022-07-17 LAB — CBC
HCT: 39.7 % (ref 36.0–46.0)
Hemoglobin: 13.2 g/dL (ref 12.0–15.0)
MCH: 30.3 pg (ref 26.0–34.0)
MCHC: 33.2 g/dL (ref 30.0–36.0)
MCV: 91.1 fL (ref 80.0–100.0)
Platelets: 294 10*3/uL (ref 150–400)
RBC: 4.36 MIL/uL (ref 3.87–5.11)
RDW: 12.8 % (ref 11.5–15.5)
WBC: 5.8 10*3/uL (ref 4.0–10.5)
nRBC: 0 % (ref 0.0–0.2)

## 2022-07-17 MED ORDER — METOPROLOL TARTRATE 5 MG/5ML IV SOLN
5.0000 mg | Freq: Once | INTRAVENOUS | Status: DC
  Filled 2022-07-17: qty 5

## 2022-07-17 MED ORDER — SODIUM CHLORIDE 0.9 % IV SOLN: INTRAVENOUS | Status: DC | PRN

## 2022-07-17 MED ORDER — SODIUM CHLORIDE 0.9 % IV SOLN: INTRAVENOUS | Status: DC

## 2022-07-17 MED ORDER — LOSARTAN POTASSIUM 50 MG PO TABS: 50.0000 mg | ORAL_TABLET | Freq: Every day | ORAL | Status: DC

## 2022-07-17 MED ORDER — RISAQUAD PO CAPS
1.0000 | ORAL_CAPSULE | Freq: Every day | ORAL | Status: DC
  Administered 2022-07-17 – 2022-07-18 (×2): 1 via ORAL
  Filled 2022-07-17 (×2): qty 1

## 2022-07-17 NOTE — Plan of Care (Signed)
  Problem: Nutrition: Goal: Adequate nutrition will be maintained Outcome: Progressing   Problem: Coping: Goal: Level of anxiety will decrease Outcome: Progressing   

## 2022-07-17 NOTE — Consult Note (Signed)
Lucilla Lame, MD [facility]  [address], [place], [place] [address] Phone: [phone] Fax : [phone]  Consultation  Referring Provider:     Dr. Kurtis Bushman Primary Care Physician:  Dion Body, MD Primary Gastroenterologist: Althia Forts Reason for Consultation:     Colitis  Date of Admission:  07/16/2022 Date of Consultation:  07/17/2022         HPI:   Saki Legore is a 86 y.o. female who had been seen recently by Dr. Gustavo Lah with her last colonoscopy in 2019 with 2 adenomas found at that time.  The patient had come to the emergency department with a history of A-fib and palpitations.  The patient also reports that she suffers from migraine headaches and has episodes of vomiting and abdominal pain.  The patient has intermittent bouts of abdominal pain with diarrhea and had a CT scan that showed:  IMPRESSION: There is wall thickening in the hepatic flexure, transverse colon and descending colon suggesting inflammatory or infectious colitis. There is no evidence of intestinal obstruction or pneumoperitoneum. There is no hydronephrosis. Diverticulosis of colon without signs of focal diverticulitis. Small hiatal hernia. Fatty liver. There are low-density lesions in spleen suggesting cysts or hemangiomas. There are scattered arterial calcifications suggesting atherosclerosis. There is 7 mm right renal cyst which needs no follow-up. There are a few other tiny smaller low-density lesions in both kidneys which are too small to characterize. Follow-up multiphasic CT in 6 months may be considered.  The patient reports that she has not had any further diarrhea since she came in.  The patient was started on Zosyn.  She also reports that her abdominal pain is much better.  She has been having the symptoms of intermittent abdominal pain for some time. Blood work showed that her white cell count was normal yesterday and her hemoglobin and hematocrit are also normal.  The patient was  ordered a GI panel to rule out an infectious etiology but has not had any stools to give.  Past Medical History:  Diagnosis Date   Asthma    Atrial fibrillation ([place])    Cancer ([place])    breast ca  08/2019   Chronic kidney disease (CKD), stage III (moderate) (HCC)    Complication of anesthesia    hard to get to sleep then slow to wake for breast biopsy.   Dyspnea    Elevated lipids    GERD (gastroesophageal reflux disease)    Headache    Migraines   History of hiatal hernia    Hypertension    Lung nodule    Mitral valve disorder    PAF (paroxysmal atrial fibrillation) ([place])    ablation 2013   Stroke [facility]) 2013   no deficits   Vertigo    last episode 2-3 yrs ago   Wears hearing aid in both ears     Past Surgical History:  Procedure Laterality Date   BREAST BIOPSY Left 04/23/2010   bx /clip-neg   BREAST BIOPSY Right 2010   neg   cardiac ablasion     CATARACT EXTRACTION W/PHACO Left 10/22/2020   Procedure: CATARACT EXTRACTION PHACO AND INTRAOCULAR LENS PLACEMENT (I[facility]) LEFT 10.03 01:30.4 11.1%;  Surgeon: Leandrew Koyanagi, MD;  Location: [facility];  Service: Ophthalmology;  Laterality: Left;   CATARACT EXTRACTION W/PHACO Right 11/13/2020   Procedure: CATARACT EXTRACTION PHACO AND INTRAOCULAR LENS PLACEMENT ([place]) RIGHT;  Surgeon: Leandrew Koyanagi, MD;  Location: [facility];  Service: Ophthalmology;  Laterality: Right;  10.08 1:20.4 13.6%  COLONOSCOPY     COLONOSCOPY WITH PROPOFOL N/A 01/25/2018   Procedure: COLONOSCOPY WITH PROPOFOL;  Surgeon: Lollie Sails, MD;  Location: [facility] ENDOSCOPY;  Service: Endoscopy;  Laterality: N/A;   DIRECT LARYNGOSCOPY Left 02/18/2018   Procedure: DIRECT LARYNGOSCOPY WITH EXCISION LARYNGEAL LESION;  Surgeon: Margaretha Sheffield, MD;  Location: [facility];  Service: ENT;  Laterality: Left;   ESOPHAGOGASTRODUODENOSCOPY (EGD) WITH PROPOFOL N/A 01/25/2018   Procedure: ESOPHAGOGASTRODUODENOSCOPY (EGD) WITH PROPOFOL;  Surgeon:  Lollie Sails, MD;  Location: [facility] ENDOSCOPY;  Service: Endoscopy;  Laterality: N/A;   HERNIA REPAIR     pelvic surgery x2 for post-partum hemmoghage N/A    TONSILLECTOMY     TUBAL LIGATION      Prior to Admission medications   Medication Sig Start Date End Date Taking? Authorizing Provider  albuterol (VENTOLIN HFA) 108 (90 Base) MCG/ACT inhaler Inhale into the lungs every 6 (six) hours as needed for wheezing or shortness of breath.   Yes [provider]  cyclobenzaprine (FLEXERIL) 10 MG tablet Take 10 mg by mouth 3 (three) times daily as needed for muscle spasms.   Yes [provider]  DENTAGEL 1.1 % GEL dental gel Take 1 Application by mouth at bedtime. Apply to teeth every night at bedtime. 03/10/22  Yes [provider]  ezetimibe (ZETIA) 10 MG tablet Take 10 mg by mouth daily with supper.    Yes [provider]  famotidine (PEPCID) 20 MG tablet Take 20 mg by mouth daily with supper.   Yes [provider]  fexofenadine (ALLEGRA) 60 MG tablet Take 120 mg by mouth daily.   Yes [provider]  fluticasone (VERAMYST) 27.5 MCG/SPRAY nasal spray Place 1-2 sprays into the nose 2 (two) times daily as needed for rhinitis.   Yes [provider]  furosemide (LASIX) 20 MG tablet Take 20 mg by mouth daily as needed (fluid retention).    Yes [provider]  losartan (COZAAR) 100 MG tablet Take 100 mg by mouth daily with supper.    Yes [provider]  metoprolol succinate (TOPROL-XL) 100 MG 24 hr tablet Take 100 mg by mouth daily. 100 mg + 50 mg=150 mg at supper Take with or immediately following a meal.   Yes [provider]  metoprolol succinate (TOPROL-XL) 50 MG 24 hr tablet Take 50 mg by mouth daily with supper. 100 mg + 50 mg=150 mg at supperTake with or immediately following a meal.   Yes [provider]  NURTEC 75 MG TBDP Take 75 mg by mouth every 2 (two) hours as needed for migraine. 10/22/20  Yes  [provider]  ondansetron (ZOFRAN-ODT) 4 MG disintegrating tablet Take 1 tablet (4 mg total) by mouth every 8 (eight) hours as needed for nausea or vomiting. 04/11/22  Yes Harvest Dark, MD  promethazine (PHENERGAN) 25 MG tablet Take 25 mg by mouth every 6 (six) hours as needed for nausea or vomiting.   Yes [provider]  QULIPTA 60 MG TABS Take 1 tablet by mouth daily. 06/25/22  Yes [provider]  rivaroxaban (XARELTO) 20 MG TABS tablet Take 20 mg by mouth daily with supper.   Yes [provider]  rosuvastatin (CRESTOR) 20 MG tablet Take 20 mg by mouth daily with supper.    Yes [provider]  anastrozole (ARIMIDEX) 1 MG tablet Take 1 tablet by mouth daily. Patient not taking: Reported on 07/16/2022    [provider]  Cholecalciferol 50 MCG (2000 UT) CAPS  Take 1,000 Units by mouth daily with supper.  Patient not taking: Reported on 07/16/2022    [provider]  Erenumab-aooe (AIMOVIG) 140 MG/ML SOAJ Inject 140 mg into the skin every 28 (twenty-eight) days.  Patient not taking: Reported on 07/16/2022    [provider]  meclizine (ANTIVERT) 25 MG tablet Take 1 tablet (25 mg total) by mouth 3 (three) times daily as needed for dizziness. Patient taking differently: Take 25 mg by mouth every 6 (six) hours as needed (vertigo). 07/28/18   Coral Spikes, DO  ondansetron (ZOFRAN) 4 MG tablet Take 4 mg by mouth every 8 (eight) hours as needed for nausea or vomiting. Patient not taking: Reported on 07/16/2022    [provider]  [facility] OP Place 1 drop into the left eye daily. Patient not taking: Reported on 07/16/2022    [provider]  sertraline (ZOLOFT) 25 MG tablet Take 25 mg by mouth daily. Patient not taking: Reported on 07/16/2022    [provider]    Family History  Problem Relation Age of Onset   Breast cancer Daughter 42     Social History   Tobacco Use   Smoking  status: Never   Smokeless tobacco: Never  Vaping Use   Vaping Use: Never used  Substance Use Topics   Alcohol use: No   Drug use: No    Allergies as of 07/16/2022 - Review Complete 07/16/2022  Allergen Reaction Noted   Atorvastatin  10/16/2020   Colcrys [colchicine] Diarrhea 01/25/2018   Other Rash 12/04/2020   Propafenone Palpitations 01/25/2018    Review of Systems:    All systems reviewed and negative except where noted in HPI.   Physical Exam:  Vital signs in last 24 hours: Temp:  [97.5 F (36.4 C)-98.7 F (37.1 C)] 97.5 F (36.4 C) (08/04 1329) Pulse Rate:  [40-111] 64 (08/04 1329) Resp:  [11-20] 18 (08/04 1329) BP: (103-149)/(55-92) 135/75 (08/04 1329) SpO2:  [92 %-100 %] 99 % (08/04 1329) Weight:  [77.1 kg] 77.1 kg (08/03 1738) Last BM Date : 07/16/22 (per patietn) General:   Pleasant, cooperative in NAD Head:  Normocephalic and atraumatic. Eyes:   No icterus.   Conjunctiva pink. PERRLA. Ears:  Normal auditory acuity. Rectal:  Not performed. Msk:  Symmetrical without gross deformities.   Neurologic:  Alert and oriented x3;  grossly normal neurologically. Skin:  Intact without significant lesions or rashes. Psych:  Alert and cooperative. Normal affect.  LAB RESULTS: Recent Labs    07/16/22 1620 07/16/22 1735 07/17/22 0449  WBC 5.6 5.6 5.8  HGB 14.9 15.0 13.2  HCT 44.9 47.2* 39.7  PLT 338 331 294   BMET Recent Labs    07/16/22 1620 07/16/22 1735 07/17/22 0449  NA 135 136 137  K 4.0 4.1 4.1  CL 101 101 108  CO2 '25 23 23  '$ GLUCOSE 93 93 103*  BUN '11 11 10  '$ CREATININE 1.00 1.11* 0.83  CALCIUM 9.4 9.4 8.7*   LFT Recent Labs    07/16/22 1620  PROT 7.3  ALBUMIN 4.3  AST 34  ALT 22  ALKPHOS 75  BILITOT 1.1   PT/INR No results for input(s): "LABPROT", "INR" in the last 72 hours.  STUDIES: CT ABDOMEN PELVIS W CONTRAST  Result Date: 07/16/2022 CLINICAL DATA:  Abdominal pain EXAM: CT ABDOMEN AND PELVIS WITH CONTRAST TECHNIQUE: Multidetector  CT imaging of the abdomen and pelvis was performed using the standard protocol following bolus administration of intravenous contrast. RADIATION DOSE REDUCTION:  This exam was performed according to the departmental dose-optimization program which includes automated exposure control, adjustment of the mA and/or kV according to patient size and/or use of iterative reconstruction technique. CONTRAST:  163m OMNIPAQUE IOHEXOL 300 MG/ML  SOLN COMPARISON:  04/11/2022 FINDINGS: Lower chest: There is almost complete clearing of a small patchy density seen in left lower lobe. In the current study, there is thin linear density in the lateral aspect of left lower lung field, most likely scarring. No new infiltrates are seen. There is no pleural effusion. Hepatobiliary: There is fatty infiltration liver. There is no dilation of bile ducts. Gallbladder is not distended. Pancreas: There is fatty infiltration. No focal abnormalities are seen. Spleen: There are a few low-density lesions in the spleen each measuring less than 12 mm, possibly cysts or hemangiomas. There is coarse calcification suggesting granuloma. Adrenals/Urinary Tract: Adrenals are unremarkable. There is no hydronephrosis. There are no renal or ureteral stones. There is a 7 mm low-density structure in the upper pole of right kidney, possibly a cyst. Density measurement in this lesion is 12 Hounsfield units. There are few other smaller lesions in the renal cortex on both sides which are too small to characterize. Ureters are not dilated. Urinary bladder is unremarkable. Stomach/Bowel: Small hiatal hernia is seen. Stomach is not distended. Small bowel loops are not dilated. Appendix is not dilated. There is mild diffuse wall thickening in hepatic flexure, transverse colon and descending colon. There is no pericolic stranding. Scattered diverticula are seen in colon, especially in sigmoid without signs of focal acute diverticulitis. Vascular/Lymphatic: Scattered  arterial calcifications are seen. No new significant lymphadenopathy is seen. Reproductive: Unremarkable. Other: There is no ascites or pneumoperitoneum. Small umbilical hernia containing fat is seen. Dense calcification seen in the subcutaneous plane in the left inguinal region has not changed. Musculoskeletal: Spondylolysis is seen in the L5 vertebra with first-degree spondylolisthesis at the L5-S1 level. Degenerative changes are noted in lumbar spine with encroachment of neural foramina from L3-S1 levels. IMPRESSION: There is wall thickening in the hepatic flexure, transverse colon and descending colon suggesting inflammatory or infectious colitis. There is no evidence of intestinal obstruction or pneumoperitoneum. There is no hydronephrosis. Diverticulosis of colon without signs of focal diverticulitis. Small hiatal hernia. Fatty liver. There are low-density lesions in spleen suggesting cysts or hemangiomas. There are scattered arterial calcifications suggesting atherosclerosis. There is 7 mm right renal cyst which needs no follow-up. There are a few other tiny smaller low-density lesions in both kidneys which are too small to characterize. Follow-up multiphasic CT in 6 months may be considered. Electronically Signed   By: PElmer PickerM.D.   On: 07/16/2022 19:50   DG Chest 2 View  Result Date: 07/16/2022 CLINICAL DATA:  Chest pain EXAM: CHEST - 2 VIEW COMPARISON:  04/11/2022 FINDINGS: Heart size upper normal. Interval resolution of bilateral interstitial edema. Lungs now clear and vascularity is normal. No pleural effusion. Surgical clips in the left breast. IMPRESSION: No active cardiopulmonary disease. Electronically Signed   By: CFranchot GalloM.D.   On: 07/16/2022 18:08      Impression / Plan:   Assessment: Principal Problem:   Acute colitis Active Problems:   Stage 3b chronic kidney disease (HCC)   Paroxysmal atrial fibrillation with RVR (HCC)   Chest pain   History of breast cancer    Asthma   History of CVA (cerebrovascular accident)   Cardiac valvulopathy   Chronic migraine without aura or status migrainosus   Vertigo   Colitis  Zeynep Fantroy is a 86 y.o. y/o female with who reports a history of migraine headaches with abdominal pain nausea vomiting and came in with diarrhea which has resolved.  The patient also has had A-fib.  The patient denies any black stools or bloody stools.  She also denies any unexplained weight loss.  Plan:  This patient comes in with multiple GI issues that have been going on for a long time.  The patient's CT scan was consistent with possible colitis which may be ischemic versus infectious.  The patient has been doing well on conservative management.  The patient has been given my card to follow-up as an outpatient for her chronic GI issues.  With the patient's history of 2 colon polyps that were adenomatous the current recommendations would be a repeat colonoscopy in 7 years after the last colonoscopy.  The patient has been given my card to contact my office as an outpatient.  I will follow along with you while the patient is in hospital to make sure that her symptoms do not recur.  The patient has been explained the plan and agrees with it.  Thank you for involving me in the care of this patient.      LOS: 1 day   Lucilla Lame, MD, [facility] 07/17/2022, 3:24 PM,  Pager [phone] 7am-5pm  Check AMION for 5pm -7am coverage and on weekends   Note: This dictation was prepared with Dragon dictation along with smaller phrase technology. Any transcriptional errors that result from this process are unintentional.

## 2022-07-17 NOTE — Consult Note (Signed)
Pharmacy Antibiotic Note  Traci Holland is a 86 y.o. female admitted on 07/16/2022 with  acute colitis . PMH significant for AFib on Xarelto s/p ablation in 2013, prior MCA stroke 2013, chronic migraines, breast cancer s/p lumpectomy and XRT, HTN, asthma, mild valvulopathy. Patient presented to the ED for evaluation of a 2-day history of nausea vomiting, diarrhea and poor oral intake associated with weakness. On arrival HR in the 130s, BP of 149/92, CBC and BMP WNL. 07/16/2022 CTAP showed wall thickening in the hepatic flexure, transverse colon and descending colon suggesting inflammatory or infectious colitis. Pharmacy has been consulted for Zosyn dosing.  Plan: Continue Zosyn 3.375g IV q8h (4 hour infusion).  Height: 5' 6.5" (168.9 cm) Weight: 77.1 kg (170 lb) IBW/kg (Calculated) : 60.45  Temp (24hrs), Avg:98.1 F (36.7 C), Min:97.7 F (36.5 C), Max:98.7 F (37.1 C)  Recent Labs  Lab 07/16/22 1620 07/16/22 1735 07/17/22 0449  WBC 5.6 5.6 5.8  CREATININE 1.00 1.11* 0.83     Estimated Creatinine Clearance: 53.4 mL/min (by C-G formula based on SCr of 0.83 mg/dL).    Allergies  Allergen Reactions   Atorvastatin     Joint pain Other reaction(s): myalgias (muscle pain)   Colcrys [Colchicine] Diarrhea   Other Rash    Chlorhexidine site prep for PIV placement   Propafenone Palpitations    Antimicrobials this admission: 07/16/2022 Zosyn >>   Dose adjustments this admission: N/A  Microbiology results: N/A  Thank you for allowing pharmacy to be a part of this patient's care.  Gretel Acre, PharmD PGY1 Pharmacy Resident 07/17/2022 12:42 PM

## 2022-07-17 NOTE — Progress Notes (Signed)
PROGRESS NOTE    Traci Holland  AST:[id number] DOB: 21-Feb-1936 DOA: 07/16/2022 PCP: Dion Body, MD    Brief Narrative:  Traci Holland is a 86 y.o. female with medical history significant for Paroxysmal atrial fibrillation on Xarelto, with AF catheter ablation in 2013, prior MCA stroke 2013, chronic migraines, breast cancer s/p lumpectomy and XRT, HTN, asthma, mild valvulopathy, nonischemic stress test January 2022, seen by her cardiologist on 05/13/2022 related to an ED visit for chest pain and cleared with a 77-monthfollow-up, who presents to the ED for evaluation of a 2-day history of nausea vomiting, diarrhea and poor oral intake associated with weakness.  She has tenderness to the lower abdomen.  She denies fever or chills or dysuria.  She also notes intermittent chest pressure.  She was taken to urgent care where an EKG revealed rapid A-fib in the 140s and she was sent to the emergency room. ED course and data review: On arrival heart rate was in the 130s with otherwise normal vitals and BP of 149/92. Labs: CBC and BMP at baseline and troponin of 8 EKG, personally viewed and interpreted: A-fib at 90 with variable first-degree block.  No acute ST-T wave changes Chest x-ray nonacute  CT abdomen and pelvis showed the following: IMPRESSION: There is wall thickening in the hepatic flexure, transverse colon and descending colon suggesting inflammatory or infectious colitis.  There is no evidence of intestinal obstruction or pneumoperitoneum. There is no hydronephrosis.  Diverticulosis of colon without signs of focal diverticulitis. Small hiatal hernia. Fatty liver. There are low-density lesions in spleen suggesting cysts or hemangiomas. There are scattered arterial calcifications suggesting atherosclerosis.  There is 7 mm right renal cyst which needs no follow-up.  There are a few other tiny smaller low-density lesions in both kidneys which are too small to characterize. Follow-up  multiphasic CT in 6 months may be considered.  Patient was treated with 2 doses of metoprolol IV 2.5 mg with improvement in heart rate to 95 by admission.  She was given an IV fluid bolus and a dose of Zosyn.  Hospitalist consulted for admission for rapid A-fib and acute colitis.     8/4 in sr on tele.    Consultants:    Procedures:   Antimicrobials:  zosyn    Subjective: No diarrhea this am. Some abd pain.   Objective: Vitals:   07/17/22 0530 07/17/22 0600 07/17/22 0630 07/17/22 0645  BP: (!) 105/55 (!) 111/55 120/68   Pulse: 65 60 65 65  Resp: '14 16 16 16  '$ Temp:      TempSrc:      SpO2: 92% 93% 95% 95%  Weight:      Height:        Intake/Output Summary (Last 24 hours) at 07/17/2022 0852 Last data filed at 07/17/2022 0759 Gross per 24 hour  Intake 428.8 ml  Output --  Net 428.8 ml   Filed Weights   07/16/22 1738  Weight: 77.1 kg    Examination: Calm, NAD Cta no w/r Reg s1/s2 no gallop Soft mild ttp diffusely No edema Grossly intact Mood and affect appropriate in current setting     Data Reviewed: I have personally reviewed following labs and imaging studies  CBC: Recent Labs  Lab 07/16/22 1620 07/16/22 1735 07/17/22 0449  WBC 5.6 5.6 5.8  NEUTROABS 3.1  --   --   HGB 14.9 15.0 13.2  HCT 44.9 47.2* 39.7  MCV 91.8 91.8 91.1  PLT 338 331 2989  Basic Metabolic  Panel: Recent Labs  Lab 07/16/22 1620 07/16/22 1735 07/17/22 0449  NA 135 136 137  K 4.0 4.1 4.1  CL 101 101 108  CO2 '25 23 23  '$ GLUCOSE 93 93 103*  BUN '11 11 10  '$ CREATININE 1.00 1.11* 0.83  CALCIUM 9.4 9.4 8.7*   GFR: Estimated Creatinine Clearance: 53.4 mL/min (by C-G formula based on SCr of 0.83 mg/dL). Liver Function Tests: Recent Labs  Lab 07/16/22 1620  AST 34  ALT 22  ALKPHOS 75  BILITOT 1.1  PROT 7.3  ALBUMIN 4.3   No results for input(s): "LIPASE", "AMYLASE" in the last 168 hours. No results for input(s): "AMMONIA" in the last 168 hours. Coagulation  Profile: No results for input(s): "INR", "PROTIME" in the last 168 hours. Cardiac Enzymes: No results for input(s): "CKTOTAL", "CKMB", "CKMBINDEX", "TROPONINI" in the last 168 hours. BNP (last 3 results) No results for input(s): "PROBNP" in the last 8760 hours. HbA1C: No results for input(s): "HGBA1C" in the last 72 hours. CBG: No results for input(s): "GLUCAP" in the last 168 hours. Lipid Profile: No results for input(s): "CHOL", "HDL", "LDLCALC", "TRIG", "CHOLHDL", "LDLDIRECT" in the last 72 hours. Thyroid Function Tests: No results for input(s): "TSH", "T4TOTAL", "FREET4", "T3FREE", "THYROIDAB" in the last 72 hours. Anemia Panel: No results for input(s): "VITAMINB12", "FOLATE", "FERRITIN", "TIBC", "IRON", "RETICCTPCT" in the last 72 hours. Sepsis Labs: No results for input(s): "PROCALCITON", "LATICACIDVEN" in the last 168 hours.  No results found for this or any previous visit (from the past 240 hour(s)).       Radiology Studies: CT ABDOMEN PELVIS W CONTRAST  Result Date: 07/16/2022 CLINICAL DATA:  Abdominal pain EXAM: CT ABDOMEN AND PELVIS WITH CONTRAST TECHNIQUE: Multidetector CT imaging of the abdomen and pelvis was performed using the standard protocol following bolus administration of intravenous contrast. RADIATION DOSE REDUCTION: This exam was performed according to the departmental dose-optimization program which includes automated exposure control, adjustment of the mA and/or kV according to patient size and/or use of iterative reconstruction technique. CONTRAST:  159m OMNIPAQUE IOHEXOL 300 MG/ML  SOLN COMPARISON:  04/11/2022 FINDINGS: Lower chest: There is almost complete clearing of a small patchy density seen in left lower lobe. In the current study, there is thin linear density in the lateral aspect of left lower lung field, most likely scarring. No new infiltrates are seen. There is no pleural effusion. Hepatobiliary: There is fatty infiltration liver. There is no  dilation of bile ducts. Gallbladder is not distended. Pancreas: There is fatty infiltration. No focal abnormalities are seen. Spleen: There are a few low-density lesions in the spleen each measuring less than 12 mm, possibly cysts or hemangiomas. There is coarse calcification suggesting granuloma. Adrenals/Urinary Tract: Adrenals are unremarkable. There is no hydronephrosis. There are no renal or ureteral stones. There is a 7 mm low-density structure in the upper pole of right kidney, possibly a cyst. Density measurement in this lesion is 12 Hounsfield units. There are few other smaller lesions in the renal cortex on both sides which are too small to characterize. Ureters are not dilated. Urinary bladder is unremarkable. Stomach/Bowel: Small hiatal hernia is seen. Stomach is not distended. Small bowel loops are not dilated. Appendix is not dilated. There is mild diffuse wall thickening in hepatic flexure, transverse colon and descending colon. There is no pericolic stranding. Scattered diverticula are seen in colon, especially in sigmoid without signs of focal acute diverticulitis. Vascular/Lymphatic: Scattered arterial calcifications are seen. No new significant lymphadenopathy is seen. Reproductive: Unremarkable. Other: There  is no ascites or pneumoperitoneum. Small umbilical hernia containing fat is seen. Dense calcification seen in the subcutaneous plane in the left inguinal region has not changed. Musculoskeletal: Spondylolysis is seen in the L5 vertebra with first-degree spondylolisthesis at the L5-S1 level. Degenerative changes are noted in lumbar spine with encroachment of neural foramina from L3-S1 levels. IMPRESSION: There is wall thickening in the hepatic flexure, transverse colon and descending colon suggesting inflammatory or infectious colitis. There is no evidence of intestinal obstruction or pneumoperitoneum. There is no hydronephrosis. Diverticulosis of colon without signs of focal diverticulitis.  Small hiatal hernia. Fatty liver. There are low-density lesions in spleen suggesting cysts or hemangiomas. There are scattered arterial calcifications suggesting atherosclerosis. There is 7 mm right renal cyst which needs no follow-up. There are a few other tiny smaller low-density lesions in both kidneys which are too small to characterize. Follow-up multiphasic CT in 6 months may be considered. Electronically Signed   By: Elmer Picker M.D.   On: 07/16/2022 19:50   DG Chest 2 View  Result Date: 07/16/2022 CLINICAL DATA:  Chest pain EXAM: CHEST - 2 VIEW COMPARISON:  04/11/2022 FINDINGS: Heart size upper normal. Interval resolution of bilateral interstitial edema. Lungs now clear and vascularity is normal. No pleural effusion. Surgical clips in the left breast. IMPRESSION: No active cardiopulmonary disease. Electronically Signed   By: Franchot Gallo M.D.   On: 07/16/2022 18:08        Scheduled Meds:  famotidine  20 mg Oral Q supper   losartan  100 mg Oral Q supper   metoprolol succinate  100 mg Oral Q supper   metoprolol succinate  50 mg Oral Q supper   metoprolol tartrate  5 mg Intravenous Once   rivaroxaban  20 mg Oral Q supper   rosuvastatin  20 mg Oral Q supper   Continuous Infusions:  piperacillin-tazobactam (ZOSYN)  IV Stopped (07/17/22 0739)    Assessment & Plan:   Principal Problem:   Acute colitis Active Problems:   Paroxysmal atrial fibrillation with RVR (HCC)   Chest pain   Stage 3b chronic kidney disease (HCC)   History of breast cancer   Asthma   History of CVA (cerebrovascular accident)   Cardiac valvulopathy   Chronic migraine without aura or status migrainosus   Vertigo   Acute colitis Found on CT No diarrhea this a.m. Continue clear liquid diet GI consulted as patient's last colonoscopy was several years ago and had multiple polyps.  She reports her GI doctor retired.  Appears that she has been lost and transition during [place]. Ivf for gentle  hydration.   Paroxysmal atrial fibrillation with RVR (HCC) Chronic anticoagulation with Xarelto Currently in sinus Continue telemetry Continue anticoagulation and beta-blockers Decrease losartan dose if need to increase beta blk    Chest pain Currently chest pain-free and is likely related to rapid A-fib Troponin negative.  Chest pressure likely related to rapid A-fib.  Nonischemic EKG  Nonischemic stress test January 2022 Consider cardiology consult for further risk stratification if repeated episodes following rate control of A-fib 8/4 asx     Stage 3b chronic kidney disease ([place]) Stable. monitor   Vertigo Stable.  Continue meclizine   Chronic migraine without aura or status migrainosus Stable.  On Qulipta and Nurtec and Aimovig (pending med reconciliation)   Cardiac valvulopathy No acute issues suspected.  Last follow-up with cardiology May 2023   History of CVA (cerebrovascular accident) History of CVA 2013 Continue Crestor and Xarelto   Asthma Not  acutely exacerbated.  Continue albuterol as needed.   History of breast cancer S/p lumpectomy and XRT.   Patient no longer takes anastrozole due to side effects       DVT prophylaxis: Xarelto Code Status: Full Family Communication:  Disposition Plan:  Status is: Inpatient Remains inpatient appropriate because: IV treatment        LOS: 1 day   Time spent: 43 min    Nolberto Hanlon, MD [facility] Pager [phone]  If 7PM-7AM, please contact night-coverage 07/17/2022, 8:52 AM

## 2022-07-17 NOTE — ED Notes (Signed)
This RN to bedside, pt. Woken up, repositioned in bed to eat breakfast of clear liquids. Pt. Denies pain or further need at this time. Pt. Is alert and responsive, chatting with staff.

## 2022-07-18 DIAGNOSIS — K529 Noninfective gastroenteritis and colitis, unspecified: Secondary | ICD-10-CM | POA: Diagnosis not present

## 2022-07-18 MED ORDER — AMOXICILLIN-POT CLAVULANATE 875-125 MG PO TABS: 1.0000 | ORAL_TABLET | Freq: Two times a day (BID) | ORAL | 0 refills | Status: AC

## 2022-07-18 MED ORDER — RISAQUAD PO CAPS: 1.0000 | ORAL_CAPSULE | Freq: Every day | ORAL | 0 refills | Status: AC

## 2022-07-18 NOTE — Progress Notes (Signed)
Discharge instructions and med details reviewed with patient all questions answered. Printed AVS given to pt. IV removed.  Pt escorted out via wheelchair.

## 2022-07-18 NOTE — Discharge Summary (Signed)
Traci Holland GHW:[id number] DOB: [birth year] DOA: 07/16/2022  PCP: Dion Body, MD  Admit date: 07/16/2022 Discharge date: 07/18/2022  Admitted From: Home Disposition: Home  Recommendations for Outpatient Follow-up:  Follow up with PCP in 1 week Please obtain BMP/CBC in one week Please follow up GI Dr. Allen Norris in 2 weeks     Discharge Condition:Stable CODE STATUS: Full Diet recommendation: Heart Healthy  Brief/Interim Summary: Per VEL:FYBO Cotrell is a 86 y.o. female with medical history significant for Paroxysmal atrial fibrillation on Xarelto, with AF catheter ablation in 2013, prior MCA stroke 2013, chronic migraines, breast cancer s/p lumpectomy and XRT, HTN, asthma, mild valvulopathy, nonischemic stress test January 2022, seen by her cardiologist on 05/13/2022 related to an ED visit for chest pain and cleared with a 56-monthfollow-up, who presents to the ED for evaluation of a 2-day history of nausea vomiting, diarrhea and poor oral intake associated with weakness.  She has tenderness to the lower abdomen.  She denies fever or chills or dysuria.  She also notes intermittent chest pressure.  She was taken to urgent care where an EKG revealed rapid A-fib in the 140s and she was sent to the emergency room. ED course and data review: On arrival heart rate was in the 130s with otherwise normal vitals and BP of 149/92. Labs: CBC and BMP at baseline and troponin of 8.  Found with A-fib RVR.  Chest x-ray without any acute findings.  CT of abdomen pelvis revealed colitis with full results below.  GI was consulted.  Acute colitis Found on CT No diarrhea since admission: Tolerated soft diet, was instructed to advance as tolerated GI was consulted  we will need to follow-up with GI as outpatient for colonoscopy Was started on IV antibiotics with transition to p.o. Ct with tiny low density lesions in both kidneys, radiology recommends consider multiphasic Ct in 6 months, defer to pcp     Paroxysmal atrial fibrillation with RVR (HCC) Chronic anticoagulation with Xarelto Currently in sinus Continue beta-blockers and Xarelto     Chest pain During A-fib RVR.  Resolved   Stage 3[place] Stable.    Vertigo Stable.  Continue meclizine   Chronic migraine without aura or status migrainosus Stable continue home meds   Cardiac valvulopathy  Last follow-up with cardiology May 2023 Follow-up with cardiology as outpatient as scheduled   History of CVA (cerebrovascular accident) History of CVA 2013 Continue Crestor and Xarelto   Asthma Not acutely exacerbated.     History of breast cancer S/p lumpectomy and XRT.   Patient no longer takes anastrozole due to side effects    Discharge Diagnoses:  Principal Problem:   Acute colitis Active Problems:   Paroxysmal atrial fibrillation with RVR (HCC)   Chest pain   Stage 3b chronic kidney disease (HCC)   History of breast cancer   Asthma   History of CVA (cerebrovascular accident)   Cardiac valvulopathy   Chronic migraine without aura or status migrainosus   Vertigo   Colitis    Discharge Instructions  Discharge Instructions     Call MD for:  severe uncontrolled pain   Complete by: As directed    Diet - low sodium heart healthy   Complete by: As directed    Diet - low sodium heart healthy   Complete by: As directed    Discharge instructions   Complete by: As directed    F/u Dr. WAllen Norrisin 2 week, GI F/u pcp next week   Increase  activity slowly   Complete by: As directed    Increase activity slowly   Complete by: As directed       Allergies as of 07/18/2022       Reactions   Atorvastatin    Joint pain Other reaction(s): myalgias (muscle pain)   Colcrys [colchicine] Diarrhea   Other Rash   Chlorhexidine site prep for PIV placement   Propafenone Palpitations        Medication List     STOP taking these medications    Aimovig 140 MG/ML Soaj Generic drug:  Erenumab-aooe   furosemide 20 MG tablet Commonly known as: LASIX   losartan 100 MG tablet Commonly known as: COZAAR   ondansetron 4 MG tablet Commonly known as: ZOFRAN   PREDNISOLON-MOXIFLOX-BROMFENAC OP       TAKE these medications    acidophilus Caps capsule Take 1 capsule by mouth daily for 9 days. Start taking on: July 19, 2022   albuterol 108 (90 Base) MCG/ACT inhaler Commonly known as: VENTOLIN HFA Inhale into the lungs every 6 (six) hours as needed for wheezing or shortness of breath.   amoxicillin-clavulanate 875-125 MG tablet Commonly known as: AUGMENTIN Take 1 tablet by mouth 2 (two) times daily for 9 days.   anastrozole 1 MG tablet Commonly known as: ARIMIDEX Take 1 tablet by mouth daily.   Cholecalciferol 50 MCG (2000 UT) Caps Take 1,000 Units by mouth daily with supper.   cyclobenzaprine 10 MG tablet Commonly known as: FLEXERIL Take 10 mg by mouth 3 (three) times daily as needed for muscle spasms.   DentaGel 1.1 % Gel dental gel Generic drug: sodium fluoride Take 1 Application by mouth at bedtime. Apply to teeth every night at bedtime.   ezetimibe 10 MG tablet Commonly known as: ZETIA Take 10 mg by mouth daily with supper.   famotidine 20 MG tablet Commonly known as: PEPCID Take 20 mg by mouth daily with supper.   fexofenadine 60 MG tablet Commonly known as: ALLEGRA Take 120 mg by mouth daily.   fluticasone 27.5 MCG/SPRAY nasal spray Commonly known as: VERAMYST Place 1-2 sprays into the nose 2 (two) times daily as needed for rhinitis.   meclizine 25 MG tablet Commonly known as: ANTIVERT Take 1 tablet (25 mg total) by mouth 3 (three) times daily as needed for dizziness. What changed:  when to take this reasons to take this   metoprolol succinate 50 MG 24 hr tablet Commonly known as: TOPROL-XL Take 50 mg by mouth daily with supper. 100 mg + 50 mg=150 mg at supperTake with or immediately following a meal.   metoprolol succinate 100 MG  24 hr tablet Commonly known as: TOPROL-XL Take 100 mg by mouth daily. 100 mg + 50 mg=150 mg at supper Take with or immediately following a meal.   Nurtec 75 MG Tbdp Generic drug: Rimegepant Sulfate Take 75 mg by mouth every 2 (two) hours as needed for migraine.   ondansetron 4 MG disintegrating tablet Commonly known as: ZOFRAN-ODT Take 1 tablet (4 mg total) by mouth every 8 (eight) hours as needed for nausea or vomiting.   promethazine 25 MG tablet Commonly known as: PHENERGAN Take 25 mg by mouth every 6 (six) hours as needed for nausea or vomiting.   Qulipta 60 MG Tabs Generic drug: Atogepant Take 1 tablet by mouth daily.   rivaroxaban 20 MG Tabs tablet Commonly known as: XARELTO Take 20 mg by mouth daily with supper.   rosuvastatin 20 MG tablet Commonly known as:  CRESTOR Take 20 mg by mouth daily with supper.   sertraline 25 MG tablet Commonly known as: ZOLOFT Take 25 mg by mouth daily.        Follow-up Information     Dion Body, MD Follow up in 1 week(s).   Specialty: Family Medicine Contact information: [place] [address] [phone]         Lucilla Lame, MD Follow up in 2 week(s).   Specialty: Gastroenterology Contact information: [place]  [place] [address] [phone]                Allergies  Allergen Reactions   Atorvastatin     Joint pain Other reaction(s): myalgias (muscle pain)   Colcrys [Colchicine] Diarrhea   Other Rash    Chlorhexidine site prep for PIV placement   Propafenone Palpitations    Consultations: GI  Procedures/Studies: CT ABDOMEN PELVIS W CONTRAST  Result Date: 07/16/2022 CLINICAL DATA:  Abdominal pain EXAM: CT ABDOMEN AND PELVIS WITH CONTRAST TECHNIQUE: Multidetector CT imaging of the abdomen and pelvis was performed using the standard protocol following bolus administration of intravenous contrast. RADIATION DOSE REDUCTION: This exam was  performed according to the departmental dose-optimization program which includes automated exposure control, adjustment of the mA and/or kV according to patient size and/or use of iterative reconstruction technique. CONTRAST:  161m OMNIPAQUE IOHEXOL 300 MG/ML  SOLN COMPARISON:  04/11/2022 FINDINGS: Lower chest: There is almost complete clearing of a small patchy density seen in left lower lobe. In the current study, there is thin linear density in the lateral aspect of left lower lung field, most likely scarring. No new infiltrates are seen. There is no pleural effusion. Hepatobiliary: There is fatty infiltration liver. There is no dilation of bile ducts. Gallbladder is not distended. Pancreas: There is fatty infiltration. No focal abnormalities are seen. Spleen: There are a few low-density lesions in the spleen each measuring less than 12 mm, possibly cysts or hemangiomas. There is coarse calcification suggesting granuloma. Adrenals/Urinary Tract: Adrenals are unremarkable. There is no hydronephrosis. There are no renal or ureteral stones. There is a 7 mm low-density structure in the upper pole of right kidney, possibly a cyst. Density measurement in this lesion is 12 Hounsfield units. There are few other smaller lesions in the renal cortex on both sides which are too small to characterize. Ureters are not dilated. Urinary bladder is unremarkable. Stomach/Bowel: Small hiatal hernia is seen. Stomach is not distended. Small bowel loops are not dilated. Appendix is not dilated. There is mild diffuse wall thickening in hepatic flexure, transverse colon and descending colon. There is no pericolic stranding. Scattered diverticula are seen in colon, especially in sigmoid without signs of focal acute diverticulitis. Vascular/Lymphatic: Scattered arterial calcifications are seen. No new significant lymphadenopathy is seen. Reproductive: Unremarkable. Other: There is no ascites or pneumoperitoneum. Small umbilical hernia  containing fat is seen. Dense calcification seen in the subcutaneous plane in the left inguinal region has not changed. Musculoskeletal: Spondylolysis is seen in the L5 vertebra with first-degree spondylolisthesis at the L5-S1 level. Degenerative changes are noted in lumbar spine with encroachment of neural foramina from L3-S1 levels. IMPRESSION: There is wall thickening in the hepatic flexure, transverse colon and descending colon suggesting inflammatory or infectious colitis. There is no evidence of intestinal obstruction or pneumoperitoneum. There is no hydronephrosis. Diverticulosis of colon without signs of focal diverticulitis. Small hiatal hernia. Fatty liver. There are low-density lesions in spleen suggesting cysts or hemangiomas. There  are scattered arterial calcifications suggesting atherosclerosis. There is 7 mm right renal cyst which needs no follow-up. There are a few other tiny smaller low-density lesions in both kidneys which are too small to characterize. Follow-up multiphasic CT in 6 months may be considered. Electronically Signed   By: Elmer Picker M.D.   On: 07/16/2022 19:50   DG Chest 2 View  Result Date: 07/16/2022 CLINICAL DATA:  Chest pain EXAM: CHEST - 2 VIEW COMPARISON:  04/11/2022 FINDINGS: Heart size upper normal. Interval resolution of bilateral interstitial edema. Lungs now clear and vascularity is normal. No pleural effusion. Surgical clips in the left breast. IMPRESSION: No active cardiopulmonary disease. Electronically Signed   By: Franchot Gallo M.D.   On: 07/16/2022 18:08      Subjective: No diarrhea, no cp, no sob, no dizziness. Ambulated with no issues. No abd pain  Discharge Exam: Vitals:   07/18/22 0849 07/18/22 1100  BP: (!) 103/50 122/70  Pulse: 79   Resp: 16   Temp: 98.4 F (36.9 C)   SpO2: 95%    Vitals:   07/17/22 2022 07/18/22 0356 07/18/22 0849 07/18/22 1100  BP: 125/72 115/62 (!) 103/50 122/70  Pulse: 65 85 79   Resp: '18 18 16   '$ Temp: 98  F (36.7 C)  98.4 F (36.9 C)   TempSrc: Oral  Oral   SpO2: 99% 99% 95%   Weight:      Height:        General: Pt is alert, awake, not in acute distress Cardiovascular: RRR, S1/S2 +, no rubs, no gallops Respiratory: CTA bilaterally, no wheezing, no rhonchi Abdominal: Soft, NT, ND, bowel sounds + Extremities: no edema, no cyanosis    The results of significant diagnostics from this hospitalization (including imaging, microbiology, ancillary and laboratory) are listed below for reference.     Microbiology: No results found for this or any previous visit (from the past 240 hour(s)).   Labs: BNP (last 3 results) No results for input(s): "BNP" in the last 8760 hours. Basic Metabolic Panel: Recent Labs  Lab 07/16/22 1620 07/16/22 1735 07/17/22 0449  NA 135 136 137  K 4.0 4.1 4.1  CL 101 101 108  CO2 '25 23 23  '$ GLUCOSE 93 93 103*  BUN '11 11 10  '$ CREATININE 1.00 1.11* 0.83  CALCIUM 9.4 9.4 8.7*   Liver Function Tests: Recent Labs  Lab 07/16/22 1620  AST 34  ALT 22  ALKPHOS 75  BILITOT 1.1  PROT 7.3  ALBUMIN 4.3   No results for input(s): "LIPASE", "AMYLASE" in the last 168 hours. No results for input(s): "AMMONIA" in the last 168 hours. CBC: Recent Labs  Lab 07/16/22 1620 07/16/22 1735 07/17/22 0449  WBC 5.6 5.6 5.8  NEUTROABS 3.1  --   --   HGB 14.9 15.0 13.2  HCT 44.9 47.2* 39.7  MCV 91.8 91.8 91.1  PLT 338 331 294   Cardiac Enzymes: No results for input(s): "CKTOTAL", "CKMB", "CKMBINDEX", "TROPONINI" in the last 168 hours. BNP: Invalid input(s): "POCBNP" CBG: No results for input(s): "GLUCAP" in the last 168 hours. D-Dimer No results for input(s): "DDIMER" in the last 72 hours. Hgb A1c No results for input(s): "HGBA1C" in the last 72 hours. Lipid Profile No results for input(s): "CHOL", "HDL", "LDLCALC", "TRIG", "CHOLHDL", "LDLDIRECT" in the last 72 hours. Thyroid function studies No results for input(s): "TSH", "T4TOTAL", "T3FREE", "THYROIDAB"  in the last 72 hours.  Invalid input(s): "FREET3" Anemia work up No results for input(s): "VITAMINB12", "FOLATE", "  FERRITIN", "TIBC", "IRON", "RETICCTPCT" in the last 72 hours. Urinalysis    Component Value Date/Time   COLORURINE YELLOW (A) 04/11/2022 1105   APPEARANCEUR CLEAR (A) 04/11/2022 1105   LABSPEC >1.046 (H) 04/11/2022 1105   PHURINE 5.0 04/11/2022 1105   GLUCOSEU NEGATIVE 04/11/2022 1105   HGBUR NEGATIVE 04/11/2022 1105   BILIRUBINUR NEGATIVE 04/11/2022 1105   KETONESUR NEGATIVE 04/11/2022 1105   PROTEINUR NEGATIVE 04/11/2022 1105   NITRITE NEGATIVE 04/11/2022 1105   LEUKOCYTESUR NEGATIVE 04/11/2022 1105   Sepsis Labs Recent Labs  Lab 07/16/22 1620 07/16/22 1735 07/17/22 0449  WBC 5.6 5.6 5.8   Microbiology No results found for this or any previous visit (from the past 240 hour(s)).   Time coordinating discharge: Over 30 minutes  SIGNED:   Nolberto Hanlon, MD  [facility] 07/18/2022, 3:18 PM Pager   If 7PM-7AM, please contact night-coverage [email or web address] Password [email or web address]

## 2022-07-18 NOTE — Progress Notes (Signed)
Traci Lame, MD [address] [place], [place] [address] Phone: [phone] Fax : [phone]   Subjective: Patient has had no further diarrhea and her abdominal pain has greatly improved.  The patient has normal labs.   Objective: Vital signs in last 24 hours: Vitals:   07/17/22 2022 07/18/22 0356 07/18/22 0849 07/18/22 1100  BP: 125/72 115/62 (!) 103/50 122/70  Pulse: 65 85 79   Resp: '18 18 16   '$ Temp: 98 F (36.7 C)  98.4 F (36.9 C)   TempSrc: Oral  Oral   SpO2: 99% 99% 95%   Weight:      Height:       Weight change:   Intake/Output Summary (Last 24 hours) at 07/18/2022 1107 Last data filed at 07/18/2022 0405 Gross per 24 hour  Intake 1579.16 ml  Output --  Net 1579.16 ml     Exam: Heart:: Regular rate and rhythm or S1S2 present Lungs: normal and clear to auscultation and percussion Abdomen: soft, nontender, normal bowel sounds   Lab Results: '@LABTEST2'$ @ Micro Results: No results found for this or any previous visit (from the past 240 hour(s)). Studies/Results: CT ABDOMEN PELVIS W CONTRAST  Result Date: 07/16/2022 CLINICAL DATA:  Abdominal pain EXAM: CT ABDOMEN AND PELVIS WITH CONTRAST TECHNIQUE: Multidetector CT imaging of the abdomen and pelvis was performed using the standard protocol following bolus administration of intravenous contrast. RADIATION DOSE REDUCTION: This exam was performed according to the departmental dose-optimization program which includes automated exposure control, adjustment of the mA and/or kV according to patient size and/or use of iterative reconstruction technique. CONTRAST:  194m OMNIPAQUE IOHEXOL 300 MG/ML  SOLN COMPARISON:  04/11/2022 FINDINGS: Lower chest: There is almost complete clearing of a small patchy density seen in left lower lobe. In the current study, there is thin linear density in the lateral aspect of left lower lung field, most likely scarring. No new infiltrates are seen. There is no pleural  effusion. Hepatobiliary: There is fatty infiltration liver. There is no dilation of bile ducts. Gallbladder is not distended. Pancreas: There is fatty infiltration. No focal abnormalities are seen. Spleen: There are a few low-density lesions in the spleen each measuring less than 12 mm, possibly cysts or hemangiomas. There is coarse calcification suggesting granuloma. Adrenals/Urinary Tract: Adrenals are unremarkable. There is no hydronephrosis. There are no renal or ureteral stones. There is a 7 mm low-density structure in the upper pole of right kidney, possibly a cyst. Density measurement in this lesion is 12 Hounsfield units. There are few other smaller lesions in the renal cortex on both sides which are too small to characterize. Ureters are not dilated. Urinary bladder is unremarkable. Stomach/Bowel: Small hiatal hernia is seen. Stomach is not distended. Small bowel loops are not dilated. Appendix is not dilated. There is mild diffuse wall thickening in hepatic flexure, transverse colon and descending colon. There is no pericolic stranding. Scattered diverticula are seen in colon, especially in sigmoid without signs of focal acute diverticulitis. Vascular/Lymphatic: Scattered arterial calcifications are seen. No new significant lymphadenopathy is seen. Reproductive: Unremarkable. Other: There is no ascites or pneumoperitoneum. Small umbilical hernia containing fat is seen. Dense calcification seen in the subcutaneous plane in the left inguinal region has not changed. Musculoskeletal: Spondylolysis is seen in the L5 vertebra with first-degree spondylolisthesis at the L5-S1 level. Degenerative changes are noted in lumbar spine with encroachment of neural foramina from L3-S1 levels. IMPRESSION: There is wall thickening in the hepatic flexure, transverse colon  and descending colon suggesting inflammatory or infectious colitis. There is no evidence of intestinal obstruction or pneumoperitoneum. There is no  hydronephrosis. Diverticulosis of colon without signs of focal diverticulitis. Small hiatal hernia. Fatty liver. There are low-density lesions in spleen suggesting cysts or hemangiomas. There are scattered arterial calcifications suggesting atherosclerosis. There is 7 mm right renal cyst which needs no follow-up. There are a few other tiny smaller low-density lesions in both kidneys which are too small to characterize. Follow-up multiphasic CT in 6 months may be considered. Electronically Signed   By: Elmer Picker M.D.   On: 07/16/2022 19:50   DG Chest 2 View  Result Date: 07/16/2022 CLINICAL DATA:  Chest pain EXAM: CHEST - 2 VIEW COMPARISON:  04/11/2022 FINDINGS: Heart size upper normal. Interval resolution of bilateral interstitial edema. Lungs now clear and vascularity is normal. No pleural effusion. Surgical clips in the left breast. IMPRESSION: No active cardiopulmonary disease. Electronically Signed   By: Franchot Gallo M.D.   On: 07/16/2022 18:08   Medications: I have reviewed the patient's current medications. Scheduled Meds:  acidophilus  1 capsule Oral Daily   famotidine  20 mg Oral Q supper   metoprolol succinate  100 mg Oral Q supper   metoprolol tartrate  5 mg Intravenous Once   rivaroxaban  20 mg Oral Q supper   rosuvastatin  20 mg Oral Q supper   Continuous Infusions:  sodium chloride 10 mL/hr at 07/18/22 0405   piperacillin-tazobactam (ZOSYN)  IV 3.375 g (07/18/22 0600)   PRN Meds:.sodium chloride, acetaminophen **OR** acetaminophen, albuterol, HYDROcodone-acetaminophen, morphine injection, ondansetron **OR** ondansetron (ZOFRAN) IV   Assessment: Principal Problem:   Acute colitis Active Problems:   Stage 3b chronic kidney disease (HCC)   Paroxysmal atrial fibrillation with RVR (HCC)   Chest pain   History of breast cancer   Asthma   History of CVA (cerebrovascular accident)   Cardiac valvulopathy   Chronic migraine without aura or status migrainosus   Vertigo    Colitis    Plan: The patient is doing much better today without any further diarrhea.  The patient is set to be discharged today and has been given my card for follow-up as an outpatient.  The patient has been explained the plan and agrees with it   LOS: 2 days   Traci Holland, [facility] 07/18/2022, 11:07 AM Pager [phone] 7am-5pm  Check AMION for 5pm -7am coverage and on weekends

## 2022-07-18 NOTE — Progress Notes (Signed)
Pt ambulated around the nurse's station tolerated well. Patient walked without any assistance/assistive device.  Manual BP 122/70

## 2022-07-24 ENCOUNTER — Telehealth: Payer: Self-pay | Admitting: Gastroenterology

## 2022-07-24 NOTE — Telephone Encounter (Signed)
PT called and stated that she had been transferred several times no call back she was calling in ref to getting an appointment in the next two weeks per Dr. Allen Norris. She also stated that she will call back on Monday to speak with you.

## 2022-07-29 NOTE — Telephone Encounter (Signed)
Hosp f/u scheduled for 9/21

## 2022-08-13 ENCOUNTER — Ambulatory Visit: Payer: Medicare Other | Admitting: Gastroenterology

## 2022-09-03 ENCOUNTER — Ambulatory Visit (INDEPENDENT_AMBULATORY_CARE_PROVIDER_SITE_OTHER): Payer: Medicare Other | Admitting: Gastroenterology

## 2022-09-03 ENCOUNTER — Encounter: Payer: Self-pay | Admitting: Gastroenterology

## 2022-09-03 VITALS — BP 163/85 | HR 60 | Temp 97.7°F | Ht 66.0 in | Wt 170.0 lb

## 2022-09-03 DIAGNOSIS — R197 Diarrhea, unspecified: Secondary | ICD-10-CM

## 2022-09-03 DIAGNOSIS — R112 Nausea with vomiting, unspecified: Secondary | ICD-10-CM | POA: Diagnosis not present

## 2022-09-03 MED ORDER — PEG 3350-KCL-NABCB-NACL-NASULF 236 G PO SOLR: 4000.0000 mL | Freq: Once | ORAL | 0 refills | Status: AC

## 2022-09-03 NOTE — Progress Notes (Signed)
Primary Care Physician: Dion Body, MD  Primary Gastroenterologist:  Dr. Lucilla Lame  Chief Complaint  Patient presents with   Hospitalization Follow-up    HPI: Traci Holland is a 86 y.o. female here for follow-up after being in the hospital for colitis.  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  The patient had a colonoscopy by Dr. Gustavo Lah in 2019 and at that time had multiple polyps a few of which were adenomatous. The patient reports that she continues to have some issues with diarrhea and abdominal discomfort.  She states that her symptoms happen for a few days and then she may be okay for a week. The patient's son states that patient also has episodes of vomiting.  She has a history of reflux.  There is no report of any unexplained weight loss.  Past Medical History:  Diagnosis Date   Asthma    Atrial fibrillation ([place])    Cancer ([place])    breast ca  08/2019   Chronic kidney disease (CKD), stage III (moderate) ([facility])    Complication of anesthesia    hard to get to sleep then slow to wake for breast biopsy.   Dyspnea    Elevated lipids    GERD (gastroesophageal reflux disease)    Headache    Migraines   History of hiatal hernia    Hypertension    Lung nodule    Mitral valve disorder    PAF (paroxysmal atrial fibrillation) ([place])    ablation 2013   Stroke [facility]) 2013   no deficits   Vertigo    last episode 2-3 yrs ago   Wears hearing aid in both ears     Current Outpatient Medications  Medication Sig Dispense Refill   albuterol (VENTOLIN HFA) 108 (90 Base) MCG/ACT inhaler Inhale into the lungs every 6 (six) hours as needed for wheezing or shortness of breath.     Cholecalciferol 50 MCG (2000 UT) CAPS Take 1,000 Units by mouth daily with supper.     cyclobenzaprine (FLEXERIL) 10 MG tablet Take 10 mg by mouth 3 (three) times daily as needed for muscle spasms.     DENTAGEL  1.1 % GEL dental gel Take 1 Application by mouth at bedtime. Apply to teeth every night at bedtime.     ezetimibe (ZETIA) 10 MG tablet Take 10 mg by mouth daily with supper.      famotidine (PEPCID) 20 MG tablet Take 20 mg by mouth daily with supper.     fexofenadine (ALLEGRA) 60 MG tablet Take 120 mg by mouth daily.     fluticasone (VERAMYST) 27.5 MCG/SPRAY nasal spray Place 1-2 sprays into the nose 2 (two) times daily as needed for rhinitis.     meclizine (ANTIVERT) 25 MG tablet Take 1 tablet (25 mg total) by mouth 3 (three) times daily as needed for dizziness. (Patient taking differently: Take 25 mg by mouth every 6 (six) hours as needed (vertigo).) 30 tablet 0   metoprolol succinate (TOPROL-XL) 100 MG 24 hr tablet Take 100 mg by mouth daily. 100 mg + 50 mg=150 mg at supper Take with or immediately following a meal.     metoprolol succinate (TOPROL-XL) 50 MG 24 hr tablet Take 50 mg by mouth daily with supper. 100 mg + 50 mg=150 mg at supperTake with or immediately following a meal.     NURTEC 75 MG TBDP Take 75 mg by mouth every 2 (two)  hours as needed for migraine.     ondansetron (ZOFRAN-ODT) 4 MG disintegrating tablet Take 1 tablet (4 mg total) by mouth every 8 (eight) hours as needed for nausea or vomiting. 20 tablet 0   promethazine (PHENERGAN) 25 MG tablet Take 25 mg by mouth every 6 (six) hours as needed for nausea or vomiting.     QULIPTA 60 MG TABS Take 1 tablet by mouth daily.     rivaroxaban (XARELTO) 20 MG TABS tablet Take 20 mg by mouth daily with supper.     rosuvastatin (CRESTOR) 20 MG tablet Take 20 mg by mouth daily with supper.      sertraline (ZOLOFT) 25 MG tablet Take 25 mg by mouth daily.     No current facility-administered medications for this visit.    Allergies as of 09/03/2022 - Review Complete 09/03/2022  Allergen Reaction Noted   Atorvastatin  10/16/2020   Colcrys [colchicine] Diarrhea 01/25/2018   Other Rash 12/04/2020   Propafenone Palpitations 01/25/2018     ROS:  General: Negative for anorexia, weight loss, fever, chills, fatigue, weakness. ENT: Negative for hoarseness, difficulty swallowing , nasal congestion. CV: Negative for chest pain, angina, palpitations, dyspnea on exertion, peripheral edema.  Respiratory: Negative for dyspnea at rest, dyspnea on exertion, cough, sputum, wheezing.  GI: See history of present illness. GU:  Negative for dysuria, hematuria, urinary incontinence, urinary frequency, nocturnal urination.  Endo: Negative for unusual weight change.    Physical Examination:   BP (!) 163/85   Pulse 60   Temp 97.7 F (36.5 C) (Oral)   Wt 170 lb (77.1 kg)   LMP  (LMP Unknown)   BMI 27.03 kg/m   General: Well-nourished, well-developed in no acute distress.  Eyes: No icterus. Conjunctivae pink. Neuro: Alert and oriented x 3.  Grossly intact. Skin: Warm and dry, no jaundice.   Psych: Alert and cooperative, normal mood and affect.  Labs:    Imaging Studies: No results found.  Assessment and Plan:   Traci Holland is a 86 y.o. y/o female who comes in today after being in the hospital with what appeared to be ischemic colitis.  The patient now has episodes of diarrhea with abdominal discomfort and reports that her symptoms will last a few days.  The patient does drink milk products and has been told that she should try to see if avoiding dairy products may help her symptoms.  The patient also has intermittent nausea. Due to the patient's symptoms of continued diarrhea with abdominal pain and her nausea the patient will be set up for an EGD and colonoscopy to look for source of her symptoms.  The patient has been explained the plan and agrees with it.     Lucilla Lame, MD. [facility]al    Note: This dictation was prepared with Dragon dictation along with smaller phrase technology. Any transcriptional errors that result from this process are unintentional.

## 2022-09-04 ENCOUNTER — Telehealth: Payer: Self-pay

## 2022-09-04 NOTE — Addendum Note (Signed)
Addended by: Lurlean Nanny on: 09/04/2022 12:13 PM   Modules accepted: Orders

## 2022-09-04 NOTE — Telephone Encounter (Signed)
Procedure/blood thinner clearance has been faxed to Cardiology Isaias Cowman, MD office

## 2022-09-09 NOTE — Telephone Encounter (Signed)
Faxed response from cardiology for procedure/blood thinner clearance  Stop Xarelto 2 days prior to procedure

## 2022-09-23 NOTE — Telephone Encounter (Signed)
ERROR

## 2022-09-28 ENCOUNTER — Telehealth: Payer: Self-pay | Admitting: Gastroenterology

## 2022-09-28 NOTE — Telephone Encounter (Signed)
Pt called to cancel procedure for 10/13/2022 and will call back at a later date to resched

## 2022-10-01 NOTE — Telephone Encounter (Signed)
called Endo to cancel

## 2022-10-01 NOTE — Telephone Encounter (Signed)
Pt requested to cancel, called Endo to cancel

## 2022-10-13 ENCOUNTER — Ambulatory Visit: Admit: 2022-10-13 | Payer: Medicare Other | Admitting: Gastroenterology

## 2022-10-13 SURGERY — COLONOSCOPY WITH PROPOFOL
Anesthesia: General
